# Patient Record
Sex: Male | Born: 1980 | Race: White | Hispanic: No | Marital: Single | Smoking: Current every day smoker
Health system: Southern US, Community
[De-identification: ages and names within clinical notes are randomized; demographics above are authoritative.]

## PROBLEM LIST (undated history)

## (undated) DIAGNOSIS — R269 Unspecified abnormalities of gait and mobility: Secondary | ICD-10-CM

## (undated) DIAGNOSIS — F419 Anxiety disorder, unspecified: Secondary | ICD-10-CM

## (undated) DIAGNOSIS — I10 Essential (primary) hypertension: Secondary | ICD-10-CM

## (undated) DIAGNOSIS — Z87442 Personal history of urinary calculi: Secondary | ICD-10-CM

## (undated) DIAGNOSIS — F32A Depression, unspecified: Secondary | ICD-10-CM

## (undated) DIAGNOSIS — G894 Chronic pain syndrome: Secondary | ICD-10-CM

## (undated) DIAGNOSIS — F1910 Other psychoactive substance abuse, uncomplicated: Secondary | ICD-10-CM

## (undated) DIAGNOSIS — M4714 Other spondylosis with myelopathy, thoracic region: Secondary | ICD-10-CM

## (undated) DIAGNOSIS — F1010 Alcohol abuse, uncomplicated: Secondary | ICD-10-CM

## (undated) DIAGNOSIS — G8220 Paraplegia, unspecified: Secondary | ICD-10-CM

## (undated) DIAGNOSIS — F329 Major depressive disorder, single episode, unspecified: Secondary | ICD-10-CM

## (undated) DIAGNOSIS — G8929 Other chronic pain: Secondary | ICD-10-CM

## (undated) DIAGNOSIS — M549 Dorsalgia, unspecified: Secondary | ICD-10-CM

## (undated) HISTORY — PX: HERNIA REPAIR: SHX51

## (undated) HISTORY — DX: Major depressive disorder, single episode, unspecified: F32.9

## (undated) HISTORY — PX: BACK SURGERY: SHX140

## (undated) HISTORY — DX: Personal history of urinary calculi: Z87.442

## (undated) HISTORY — DX: Anxiety disorder, unspecified: F41.9

## (undated) HISTORY — DX: Chronic pain syndrome: G89.4

## (undated) HISTORY — PX: MANDIBLE FRACTURE SURGERY: SHX706

## (undated) HISTORY — DX: Depression, unspecified: F32.A

## (undated) HISTORY — DX: Other spondylosis with myelopathy, thoracic region: M47.14

## (undated) HISTORY — DX: Unspecified abnormalities of gait and mobility: R26.9

---
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
## 2011-07-02 ENCOUNTER — Encounter (HOSPITAL_COMMUNITY): Payer: Self-pay | Admitting: *Deleted

## 2011-07-02 ENCOUNTER — Other Ambulatory Visit: Payer: Self-pay

## 2011-07-02 ENCOUNTER — Emergency Department (HOSPITAL_COMMUNITY): Payer: Medicaid Other

## 2011-07-02 ENCOUNTER — Inpatient Hospital Stay (HOSPITAL_COMMUNITY)
Admission: EM | Admit: 2011-07-02 | Discharge: 2011-07-08 | DRG: 208 | Disposition: A | Discharge status: Home Health | Payer: Medicaid Other | Attending: Pulmonary Disease | Admitting: Pulmonary Disease

## 2011-07-02 DIAGNOSIS — J9600 Acute respiratory failure, unspecified whether with hypoxia or hypercapnia: Secondary | ICD-10-CM

## 2011-07-02 DIAGNOSIS — R197 Diarrhea, unspecified: Secondary | ICD-10-CM | POA: Diagnosis not present

## 2011-07-02 DIAGNOSIS — G8929 Other chronic pain: Secondary | ICD-10-CM | POA: Diagnosis present

## 2011-07-02 DIAGNOSIS — E872 Acidosis, unspecified: Secondary | ICD-10-CM | POA: Diagnosis present

## 2011-07-02 DIAGNOSIS — J189 Pneumonia, unspecified organism: Secondary | ICD-10-CM

## 2011-07-02 DIAGNOSIS — E876 Hypokalemia: Secondary | ICD-10-CM | POA: Diagnosis present

## 2011-07-02 DIAGNOSIS — F4542 Pain disorder with related psychological factors: Secondary | ICD-10-CM | POA: Diagnosis present

## 2011-07-02 DIAGNOSIS — J209 Acute bronchitis, unspecified: Secondary | ICD-10-CM | POA: Diagnosis present

## 2011-07-02 DIAGNOSIS — F17210 Nicotine dependence, cigarettes, uncomplicated: Secondary | ICD-10-CM | POA: Diagnosis present

## 2011-07-02 DIAGNOSIS — J159 Unspecified bacterial pneumonia: Secondary | ICD-10-CM | POA: Diagnosis present

## 2011-07-02 DIAGNOSIS — T40601A Poisoning by unspecified narcotics, accidental (unintentional), initial encounter: Secondary | ICD-10-CM | POA: Diagnosis present

## 2011-07-02 DIAGNOSIS — E46 Unspecified protein-calorie malnutrition: Secondary | ICD-10-CM | POA: Diagnosis present

## 2011-07-02 DIAGNOSIS — F17200 Nicotine dependence, unspecified, uncomplicated: Secondary | ICD-10-CM | POA: Diagnosis present

## 2011-07-02 DIAGNOSIS — R4182 Altered mental status, unspecified: Secondary | ICD-10-CM | POA: Diagnosis present

## 2011-07-02 DIAGNOSIS — R404 Transient alteration of awareness: Secondary | ICD-10-CM

## 2011-07-02 DIAGNOSIS — F1910 Other psychoactive substance abuse, uncomplicated: Secondary | ICD-10-CM | POA: Diagnosis present

## 2011-07-02 DIAGNOSIS — T50901A Poisoning by unspecified drugs, medicaments and biological substances, accidental (unintentional), initial encounter: Secondary | ICD-10-CM | POA: Diagnosis present

## 2011-07-02 DIAGNOSIS — E870 Hyperosmolality and hypernatremia: Secondary | ICD-10-CM | POA: Diagnosis present

## 2011-07-02 DIAGNOSIS — G8220 Paraplegia, unspecified: Secondary | ICD-10-CM | POA: Diagnosis present

## 2011-07-02 DIAGNOSIS — Z87828 Personal history of other (healed) physical injury and trauma: Secondary | ICD-10-CM

## 2011-07-02 DIAGNOSIS — F319 Bipolar disorder, unspecified: Secondary | ICD-10-CM | POA: Diagnosis present

## 2011-07-02 DIAGNOSIS — Z9911 Dependence on respirator [ventilator] status: Secondary | ICD-10-CM

## 2011-07-02 DIAGNOSIS — Z22322 Carrier or suspected carrier of Methicillin resistant Staphylococcus aureus: Secondary | ICD-10-CM

## 2011-07-02 HISTORY — DX: Alcohol abuse, uncomplicated: F10.10

## 2011-07-02 HISTORY — DX: Other psychoactive substance abuse, uncomplicated: F19.10

## 2011-07-02 LAB — POCT I-STAT 3, ART BLOOD GAS (G3+)
Acid-base deficit: 5 mmol/L — ABNORMAL HIGH (ref 0.0–2.0)
Bicarbonate: 22 mEq/L (ref 20.0–24.0)
Bicarbonate: 22.4 mEq/L (ref 20.0–24.0)
O2 Saturation: 88 %
O2 Saturation: 100 %
pCO2 arterial: 51.6 mmHg — ABNORMAL HIGH (ref 35.0–45.0)
pH, Arterial: 7.257 — ABNORMAL LOW (ref 7.350–7.450)
pO2, Arterial: 65 mmHg — ABNORMAL LOW (ref 80.0–100.0)

## 2011-07-02 LAB — CBC
HCT: 32.9 % — ABNORMAL LOW (ref 39.0–52.0)
Platelets: 268 10*3/uL (ref 150–400)
RDW: 13.2 % (ref 11.5–15.5)
WBC: 17.3 10*3/uL — ABNORMAL HIGH (ref 4.0–10.5)

## 2011-07-02 LAB — DIFFERENTIAL
Basophils Absolute: 0 10*3/uL (ref 0.0–0.1)
Lymphocytes Relative: 9 % — ABNORMAL LOW (ref 12–46)
Monocytes Relative: 9 % (ref 3–12)
Neutrophils Relative %: 82 % — ABNORMAL HIGH (ref 43–77)

## 2011-07-02 LAB — RAPID URINE DRUG SCREEN, HOSP PERFORMED
Amphetamines: NOT DETECTED
Barbiturates: NOT DETECTED
Benzodiazepines: POSITIVE — AB

## 2011-07-02 LAB — URINE MICROSCOPIC-ADD ON

## 2011-07-02 LAB — POCT I-STAT, CHEM 8
BUN: 16 mg/dL (ref 6–23)
Calcium, Ion: 1.06 mmol/L — ABNORMAL LOW (ref 1.12–1.32)
Glucose, Bld: 76 mg/dL (ref 70–99)
TCO2: 21 mmol/L (ref 0–100)

## 2011-07-02 LAB — POCT I-STAT TROPONIN I: Troponin i, poc: 0.04 ng/mL (ref 0.00–0.08)

## 2011-07-02 LAB — ETHANOL: Alcohol, Ethyl (B): <11 mg/dL (ref 0–11)

## 2011-07-02 LAB — GLUCOSE, CAPILLARY: Glucose-Capillary: 116 mg/dL — ABNORMAL HIGH (ref 70–99)

## 2011-07-02 LAB — URINALYSIS, ROUTINE W REFLEX MICROSCOPIC
Glucose, UA: 500 mg/dL — AB
Ketones, ur: NEGATIVE mg/dL
Nitrite: NEGATIVE
Protein, ur: 30 mg/dL — AB

## 2011-07-02 LAB — PROTIME-INR
INR: 1.22 (ref 0.00–1.49)
Prothrombin Time: 15.7 seconds — ABNORMAL HIGH (ref 11.6–15.2)

## 2011-07-02 LAB — MRSA PCR SCREENING: MRSA by PCR: POSITIVE — AB

## 2011-07-02 LAB — GRAM STAIN

## 2011-07-02 MED ORDER — NALOXONE HCL 0.4 MG/ML IJ SOLN: 0.4000 mg | Freq: Once | INTRAMUSCULAR | Status: DC

## 2011-07-02 MED ORDER — LIDOCAINE HCL (CARDIAC) 20 MG/ML IV SOLN
INTRAVENOUS | Status: AC
  Filled 2011-07-02: qty 5

## 2011-07-02 MED ORDER — KCL IN DEXTROSE-NACL 40-5-0.45 MEQ/L-%-% IV SOLN
INTRAVENOUS | Status: DC
  Administered 2011-07-02 – 2011-07-03 (×2): via INTRAVENOUS
  Filled 2011-07-02 (×6): qty 1000

## 2011-07-02 MED ORDER — BIOTENE DRY MOUTH MT LIQD
15.0000 mL | Freq: Four times a day (QID) | OROMUCOSAL | Status: DC
  Administered 2011-07-02 – 2011-07-08 (×15): 15 mL via OROMUCOSAL

## 2011-07-02 MED ORDER — NALOXONE HCL 1 MG/ML IJ SOLN
INTRAMUSCULAR | Status: AC
  Administered 2011-07-02: 0.4 mg
  Filled 2011-07-02: qty 2

## 2011-07-02 MED ORDER — SODIUM CHLORIDE 0.9 % IJ SOLN
INTRAMUSCULAR | Status: AC
  Administered 2011-07-02: 20 mL
  Filled 2011-07-02: qty 40

## 2011-07-02 MED ORDER — SODIUM CHLORIDE 0.9 % IV SOLN
25.0000 ug/h | INTRAVENOUS | Status: DC
  Administered 2011-07-02: 25 ug/h via INTRAVENOUS
  Filled 2011-07-02: qty 50

## 2011-07-02 MED ORDER — FENTANYL BOLUS VIA INFUSION
50.0000 ug | Freq: Four times a day (QID) | INTRAVENOUS | Status: DC | PRN
  Filled 2011-07-02: qty 100

## 2011-07-02 MED ORDER — ENOXAPARIN SODIUM 40 MG/0.4ML ~~LOC~~ SOLN
40.0000 mg | SUBCUTANEOUS | Status: DC
  Administered 2011-07-02 – 2011-07-04 (×3): 40 mg via SUBCUTANEOUS
  Filled 2011-07-02 (×4): qty 0.4

## 2011-07-02 MED ORDER — VANCOMYCIN HCL IN DEXTROSE 1-5 GM/200ML-% IV SOLN: 1000.0000 mg | Freq: Once | INTRAVENOUS | Status: DC

## 2011-07-02 MED ORDER — PROPOFOL 10 MG/ML IV EMUL
5.0000 ug/kg/min | INTRAVENOUS | Status: DC
  Administered 2011-07-02: 20 ug/kg/min via INTRAVENOUS
  Administered 2011-07-02: 10 ug/kg/min via INTRAVENOUS
  Administered 2011-07-03: 20 ug/kg/min via INTRAVENOUS
  Filled 2011-07-02: qty 100
  Filled 2011-07-02: qty 20
  Filled 2011-07-02: qty 100

## 2011-07-02 MED ORDER — SODIUM CHLORIDE 0.9 % IJ SOLN
INTRAMUSCULAR | Status: AC
  Administered 2011-07-03: 10 mL
  Filled 2011-07-02: qty 20

## 2011-07-02 MED ORDER — ETOMIDATE 2 MG/ML IV SOLN
INTRAVENOUS | Status: AC
  Administered 2011-07-02: 20 mg
  Filled 2011-07-02: qty 20

## 2011-07-02 MED ORDER — VANCOMYCIN HCL IN DEXTROSE 1-5 GM/200ML-% IV SOLN
1000.0000 mg | Freq: Three times a day (TID) | INTRAVENOUS | Status: DC
  Administered 2011-07-02 – 2011-07-05 (×9): 1000 mg via INTRAVENOUS
  Filled 2011-07-02 (×9): qty 200

## 2011-07-02 MED ORDER — IPRATROPIUM-ALBUTEROL 18-103 MCG/ACT IN AERO
6.0000 | INHALATION_SPRAY | Freq: Four times a day (QID) | RESPIRATORY_TRACT | Status: DC
  Administered 2011-07-02 – 2011-07-04 (×7): 6 via RESPIRATORY_TRACT
  Filled 2011-07-02: qty 14.7

## 2011-07-02 MED ORDER — ACETAMINOPHEN 160 MG/5ML PO SOLN: 650.0000 mg | ORAL | Status: DC | PRN

## 2011-07-02 MED ORDER — NALOXONE HCL 0.4 MG/ML IJ SOLN
0.4000 mg | Freq: Once | INTRAMUSCULAR | Status: AC
  Administered 2011-07-02: 0.4 mg via INTRAVENOUS

## 2011-07-02 MED ORDER — DEXTROSE 5 % IV SOLN: 1.0000 g | Freq: Once | INTRAVENOUS | Status: DC

## 2011-07-02 MED ORDER — PROPOFOL 10 MG/ML IV EMUL: 5.0000 ug/kg/min | INTRAVENOUS | Status: DC

## 2011-07-02 MED ORDER — SUCCINYLCHOLINE CHLORIDE 20 MG/ML IJ SOLN
INTRAMUSCULAR | Status: AC
  Administered 2011-07-02: 100 mg via INTRAVENOUS
  Filled 2011-07-02: qty 10

## 2011-07-02 MED ORDER — SODIUM CHLORIDE 0.9 % IV BOLUS (SEPSIS)
1000.0000 mL | Freq: Once | INTRAVENOUS | Status: AC
  Administered 2011-07-02: 1000 mL via INTRAVENOUS

## 2011-07-02 MED ORDER — PIPERACILLIN-TAZOBACTAM 3.375 G IVPB
3.3750 g | Freq: Three times a day (TID) | INTRAVENOUS | Status: DC
  Administered 2011-07-02 – 2011-07-05 (×8): 3.375 g via INTRAVENOUS
  Filled 2011-07-02 (×9): qty 50

## 2011-07-02 MED ORDER — PIPERACILLIN-TAZOBACTAM 3.375 G IVPB
3.3750 g | Freq: Once | INTRAVENOUS | Status: AC
  Administered 2011-07-02: 3.375 g via INTRAVENOUS
  Filled 2011-07-02: qty 50

## 2011-07-02 MED ORDER — PANTOPRAZOLE SODIUM 40 MG IV SOLR
40.0000 mg | Freq: Every day | INTRAVENOUS | Status: DC
  Administered 2011-07-02: 40 mg via INTRAVENOUS
  Filled 2011-07-02 (×2): qty 40

## 2011-07-02 MED ORDER — ROCURONIUM BROMIDE 50 MG/5ML IV SOLN
INTRAVENOUS | Status: AC
  Filled 2011-07-02: qty 2

## 2011-07-02 MED ORDER — CHLORHEXIDINE GLUCONATE 0.12 % MT SOLN
15.0000 mL | Freq: Two times a day (BID) | OROMUCOSAL | Status: DC
  Administered 2011-07-02 – 2011-07-04 (×4): 15 mL via OROMUCOSAL
  Filled 2011-07-02 (×4): qty 15

## 2011-07-02 NOTE — ED Notes (Signed)
2115-01 Ready 

## 2011-07-02 NOTE — ED Notes (Signed)
Father at bedside. Pt follow commands when awake. Pt on bear hugger and fluid warmer. Dad states is on pain management but has not been able to fight the pain from his back injury.

## 2011-07-02 NOTE — ED Notes (Signed)
o2 sat 100 % with pt on vent per RT.  Pharmacy called again for diprivan gtt.  Pt tolerating vent well at present.

## 2011-07-02 NOTE — ED Notes (Signed)
Pt asked to cough, arrived back from ct with nrb off and tech stated tank ran out.  Placed back on nrb. Md at bedside. Gave narcan 0.4mg  iv and o2 sat returned to 100% on nrb.

## 2011-07-02 NOTE — Progress Notes (Signed)
ANTIBIOTIC CONSULT NOTE - INITIAL  Pharmacy Consult for Vancomycin & Zosyn Indication: rule out pneumonia  No Known Allergies  Patient Measurements: Height: 6' (182.9 cm) Weight: 180 lb (81.647 kg) IBW/kg (Calculated) : 77.6   Vital Signs: Temp: 99 F (37.2 C) (02/14 1430) Temp src: Other (Comment) (02/14 1300) BP: 97/50 mmHg (02/14 1430) Pulse Rate: 111  (02/14 1430) Intake/Output from previous day:   Intake/Output from this shift:    Labs:  Basename 07/02/11 1121 07/02/11 1057  WBC -- 17.3*  HGB 11.2* 10.6*  PLT -- 268  LABCREA -- --  CREATININE 1.20 --   Estimated Creatinine Clearance: 99.7 ml/min (by C-G formula based on Cr of 1.2).  Microbiology: No results found for this or any previous visit (from the past 720 hour(s)).  Medical History: Past Medical History  Diagnosis Date  . Bipolar 1 disorder, depressed   . Drug abuse     Medications:  See med rec  Assessment: 31 yo M admitted after found unresponsive this morning, CPR performed, Narcan given and patient become responsive.  Patient recently had spinal surgery in December.  To begin Vancomycin and Zosyn empirically for possible pneumonia (Zosyn 3.375 gm IV given at 1437 and Vancomycin 1000 mg IV to be given in the ED).  Patient is currently intubated.    Goal of Therapy:  Vancomycin trough level 15-20 mcg/ml  Plan:  1.  Zosyn 3.375 gm IV q 8hr 2.  Vancomycin 1000 mg IV q8hr 3.  F/U renal function, cultures, obtain vancomycin trough as needed  Rolland Porter, Pharm.D., BCPS Clinical Pharmacist Pager: [phone]

## 2011-07-02 NOTE — Procedures (Addendum)
Central Venous Catheter Insertion Procedure Note Jason Dominguez [id number] 21-Mar-1981  Procedure: Insertion of Central Venous Catheter Indications: Drug and/or fluid administration  Procedure Details Consent: Risks of procedure as well as the alternatives and risks of each were explained to the (patient/caregiver).  Consent for procedure obtained. Time Out: Verified patient identification, verified procedure, site/side was marked, verified correct patient position, special equipment/implants available, medications/allergies/relevent history reviewed, required imaging and test results available.  Performed  Maximum sterile technique was used including antiseptics, cap, gloves, gown, hand hygiene, mask and sheet. Skin prep: Chlorhexidine; local anesthetic administered A antimicrobial bonded/coated triple lumen catheter was placed in the left subclavian vein using the Seldinger technique.  Evaluation Blood flow good Complications: No apparent complications Patient did tolerate procedure well. Chest X-ray ordered to verify placement.  CXR: CVL well positioned. No pneumothorax.  Jason Dominguez 07/02/2011, 11:20 PM

## 2011-07-02 NOTE — ED Notes (Signed)
Intubated with 8.0 taped 24 at the lip with good color change and chest rise and fall and auscultation positive.
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
## 2011-07-02 NOTE — ED Notes (Signed)
Pt sedated on diprivan and tolerating vent well. Vss.

## 2011-07-02 NOTE — ED Notes (Signed)
Md placing central line at present, received verbal consent from father, Eula Listen aware as well.

## 2011-07-02 NOTE — Procedures (Addendum)
Intubation Procedure Note Jason Dominguez [id number] [birth year]  Procedure: Intubation Indications: Airway protection and maintenance  Procedure Details Consent: Risks of procedure as well as the alternatives and risks of each were explained to the (patient/caregiver).  Consent for procedure obtained. Time Out: Verified patient identification, verified procedure, site/side was marked, verified correct patient position, special equipment/implants available, medications/allergies/relevent history reviewed, required imaging and test results available.  Performed  Could not adequately visualize airway with #3 Mac blade. Cords easily visualized with Glidescope. 8.0 ETT passed on second attempt    Evaluation Hemodynamic Status: BP stable throughout; O2 sats: transiently fell during during procedure Patient's Current Condition: stable Complications: No apparent complications Patient did tolerate procedure well. Chest X-ray ordered to verify placement.  CXR: tube position acceptable.   Jason Dominguez 07/02/2011

## 2011-07-02 NOTE — ED Notes (Signed)
Pt to ct 

## 2011-07-02 NOTE — ED Notes (Signed)
Bear hugger and fluid warmer off. Normotensive now.

## 2011-07-02 NOTE — ED Notes (Signed)
Check patient cbg it was 14 notified RN Nedra Hai of cbg

## 2011-07-02 NOTE — ED Notes (Signed)
Xray called for portable  

## 2011-07-02 NOTE — ED Notes (Signed)
Attempted to call report, will try again when nurse available.

## 2011-07-02 NOTE — ED Notes (Signed)
Patient presents to ed via [facility] family last saw at 230a, was found 10 am unresponsive ems was called, ems found patient pulseless, 3 mins of CPR and was given narcan 2 mg IV patient became responsive. [name] Upon arrival to ed  Responses to deep sternal rub with movement and moaning no verbal response. ems states patient had surgery in Dec and had rods put in his back was discharged from rehab. Yest. Patient has been using fentanyl patches unable to find any on at present.

## 2011-07-03 ENCOUNTER — Inpatient Hospital Stay (HOSPITAL_COMMUNITY): Payer: Medicaid Other

## 2011-07-03 LAB — POCT I-STAT 3, ART BLOOD GAS (G3+)
Acid-base deficit: 1 mmol/L (ref 0.0–2.0)
O2 Saturation: 99 %
TCO2: 26 mmol/L (ref 0–100)
pCO2 arterial: 43.8 mmHg (ref 35.0–45.0)
pO2, Arterial: 132 mmHg — ABNORMAL HIGH (ref 80.0–100.0)

## 2011-07-03 LAB — BASIC METABOLIC PANEL
BUN: 15 mg/dL (ref 6–23)
CO2: 26 mEq/L (ref 19–32)
GFR calc non Af Amer: >90 mL/min (ref >90)
Glucose, Bld: 116 mg/dL — ABNORMAL HIGH (ref 70–99)
Potassium: 3.5 mEq/L (ref 3.5–5.1)
Sodium: 139 mEq/L (ref 135–145)

## 2011-07-03 LAB — CBC
HCT: 34.4 % — ABNORMAL LOW (ref 39.0–52.0)
Hemoglobin: 11.5 g/dL — ABNORMAL LOW (ref 13.0–17.0)
MCH: 28.9 pg (ref 26.0–34.0)
MCHC: 33.4 g/dL (ref 30.0–36.0)
RBC: 3.98 MIL/uL — ABNORMAL LOW (ref 4.22–5.81)

## 2011-07-03 MED ORDER — SODIUM CHLORIDE 0.9 % IJ SOLN
INTRAMUSCULAR | Status: AC
  Filled 2011-07-03: qty 20

## 2011-07-03 MED ORDER — SODIUM CHLORIDE 0.9 % IV SOLN
INTRAVENOUS | Status: DC
  Administered 2011-07-03: 16:00:00 via INTRAVENOUS

## 2011-07-03 MED ORDER — FENTANYL CITRATE 0.05 MG/ML IJ SOLN
100.0000 ug | INTRAMUSCULAR | Status: DC | PRN
  Administered 2011-07-04 (×3): 100 ug via INTRAVENOUS
  Filled 2011-07-03 (×3): qty 2

## 2011-07-03 MED ORDER — SODIUM CHLORIDE 0.9 % IJ SOLN
INTRAMUSCULAR | Status: AC
  Administered 2011-07-03: 20 mL
  Filled 2011-07-03: qty 20

## 2011-07-03 MED ORDER — PROMOTE PO LIQD
1000.0000 mL | ORAL | Status: DC
  Administered 2011-07-03: 1000 mL
  Filled 2011-07-03 (×6): qty 1000

## 2011-07-03 MED ORDER — MIDAZOLAM HCL 2 MG/2ML IJ SOLN: 2.0000 mg | INTRAMUSCULAR | Status: DC | PRN

## 2011-07-03 MED ORDER — PANTOPRAZOLE SODIUM 40 MG PO PACK
40.0000 mg | PACK | Freq: Every day | ORAL | Status: DC
  Administered 2011-07-03: 40 mg
  Filled 2011-07-03 (×2): qty 20

## 2011-07-03 MED ORDER — FENTANYL CITRATE 0.05 MG/ML IJ SOLN
50.0000 ug | INTRAMUSCULAR | Status: DC | PRN
  Administered 2011-07-03: 100 ug via INTRAVENOUS
  Filled 2011-07-03: qty 2
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
## 2011-07-03 NOTE — Progress Notes (Signed)
Clinical Social Worker met with pt and parents at bedside.  CSW provided extensive emotional support and opportunity for family to process difficult feelings.  CSW provided contact information.  CSW to continue to follow and assist as needed.  Angelia Mould, MSW, [place] [phone]
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
## 2011-07-03 NOTE — Progress Notes (Signed)
Clinical Social Worker received completed assessment from ED CSW.  Please see shadow chart for details.  CSW to continue to follow and assist as needed.  Angelia Mould, MSW, [place] [phone]

## 2011-07-04 ENCOUNTER — Inpatient Hospital Stay (HOSPITAL_COMMUNITY): Payer: Medicaid Other

## 2011-07-04 LAB — BASIC METABOLIC PANEL
BUN: 10 mg/dL (ref 6–23)
CO2: 29 mEq/L (ref 19–32)
Chloride: 105 mEq/L (ref 96–112)
GFR calc non Af Amer: >90 mL/min (ref >90)
Glucose, Bld: 115 mg/dL — ABNORMAL HIGH (ref 70–99)
Potassium: 3.2 mEq/L — ABNORMAL LOW (ref 3.5–5.1)
Sodium: 141 mEq/L (ref 135–145)

## 2011-07-04 LAB — CBC
MCHC: 33.8 g/dL (ref 30.0–36.0)
RDW: 13.3 % (ref 11.5–15.5)
WBC: 8.4 10*3/uL (ref 4.0–10.5)

## 2011-07-04 LAB — VANCOMYCIN, TROUGH: Vancomycin Tr: 14.2 ug/mL (ref 10.0–20.0)

## 2011-07-04 MED ORDER — TRAMADOL HCL 50 MG PO TABS
50.0000 mg | ORAL_TABLET | Freq: Four times a day (QID) | ORAL | Status: DC | PRN
  Administered 2011-07-06: 50 mg via ORAL
  Filled 2011-07-04 (×2): qty 1

## 2011-07-04 MED ORDER — PANTOPRAZOLE SODIUM 40 MG PO PACK
40.0000 mg | PACK | Freq: Every day | ORAL | Status: DC
  Filled 2011-07-04: qty 20

## 2011-07-04 MED ORDER — DOCUSATE SODIUM 100 MG PO CAPS
100.0000 mg | ORAL_CAPSULE | Freq: Two times a day (BID) | ORAL | Status: DC
  Administered 2011-07-04 – 2011-07-05 (×3): 100 mg via ORAL
  Filled 2011-07-04 (×4): qty 1

## 2011-07-04 MED ORDER — IPRATROPIUM BROMIDE 0.02 % IN SOLN
0.5000 mg | Freq: Four times a day (QID) | RESPIRATORY_TRACT | Status: DC
  Administered 2011-07-04 – 2011-07-05 (×4): 0.5 mg via RESPIRATORY_TRACT
  Filled 2011-07-04 (×4): qty 2.5

## 2011-07-04 MED ORDER — ALBUTEROL SULFATE (5 MG/ML) 0.5% IN NEBU
2.5000 mg | INHALATION_SOLUTION | Freq: Four times a day (QID) | RESPIRATORY_TRACT | Status: DC
  Administered 2011-07-04 – 2011-07-05 (×4): 2.5 mg via RESPIRATORY_TRACT
  Filled 2011-07-04 (×4): qty 0.5

## 2011-07-04 MED ORDER — BACLOFEN 10 MG PO TABS
10.0000 mg | ORAL_TABLET | Freq: Three times a day (TID) | ORAL | Status: DC
  Administered 2011-07-04 – 2011-07-08 (×13): 10 mg via ORAL
  Filled 2011-07-04 (×15): qty 1

## 2011-07-04 MED ORDER — ALPRAZOLAM 0.25 MG PO TABS
0.2500 mg | ORAL_TABLET | Freq: Four times a day (QID) | ORAL | Status: DC | PRN
  Administered 2011-07-04 – 2011-07-08 (×6): 0.25 mg via ORAL
  Filled 2011-07-04 (×7): qty 1

## 2011-07-04 MED ORDER — SODIUM CHLORIDE 0.9 % IJ SOLN
INTRAMUSCULAR | Status: AC
  Filled 2011-07-04: qty 20

## 2011-07-04 MED ORDER — PANTOPRAZOLE SODIUM 40 MG PO TBEC
40.0000 mg | DELAYED_RELEASE_TABLET | Freq: Every day | ORAL | Status: DC
  Administered 2011-07-05: 40 mg via ORAL
  Filled 2011-07-04: qty 1

## 2011-07-04 MED ORDER — ACETAMINOPHEN 160 MG/5ML PO SOLN: 650.0000 mg | ORAL | Status: DC | PRN

## 2011-07-04 MED ORDER — MORPHINE SULFATE 2 MG/ML IJ SOLN: 1.0000 mg | INTRAMUSCULAR | Status: DC | PRN

## 2011-07-04 MED ORDER — GABAPENTIN 300 MG PO CAPS
300.0000 mg | ORAL_CAPSULE | Freq: Three times a day (TID) | ORAL | Status: DC
  Administered 2011-07-04 – 2011-07-08 (×12): 300 mg via ORAL
  Filled 2011-07-04 (×15): qty 1

## 2011-07-04 NOTE — Progress Notes (Signed)
ANTIBIOTIC CONSULT NOTE - FOLLOW UP  Pharmacy Consult for vancomycin Indication: rule out pneumonia  No Known Allergies  Patient Measurements: Height: 6' (182.9 cm) Weight: 191 lb 9.3 oz (86.9 kg) IBW/kg (Calculated) : 77.6   Vital Signs: Temp: 99.3 F (37.4 C) (02/16 1500) BP: 118/55 mmHg (02/16 1500) Pulse Rate: 105  (02/16 1500) Intake/Output from previous day: 02/15 0701 - 02/16 0700 In: 2249.2 [I.V.:1329.2; NG/GT:420; IV Piggyback:500] Out: 1930 [Urine:1755; Emesis/NG output:175] Intake/Output from this shift: Total I/O In: 700 [I.V.:325; NG/GT:125; IV Piggyback:250] Out: 825 [Urine:825]  Labs:  [facility] 07/04/11 0500 07/03/11 0500 07/02/11 1121 07/02/11 1057  WBC 8.4 12.3* -- 17.3*  HGB 9.9* 11.5* 11.2* --  PLT 214 228 -- 268  LABCREA -- -- -- --  CREATININE 0.67 0.86 1.20 --   Estimated Creatinine Clearance: 149.5 ml/min (by C-G formula based on Cr of 0.67).  Basename 07/04/11 0500  VANCOTROUGH 14.2  VANCOPEAK --  VANCORANDOM --  GENTTROUGH --  GENTPEAK --  GENTRANDOM --  TOBRATROUGH --  TOBRAPEAK --  TOBRARND --  AMIKACINPEAK --  AMIKACINTROU --  AMIKACIN --     Microbiology: Recent Results (from the past 720 hour(s))  URINE CULTURE     Status: Normal (Preliminary result)   Collection Time   07/02/11 11:31 AM      Component Value Range Status Comment   Specimen Description URINE, RANDOM   Final    Special Requests NONE   Final    Culture  Setup Time [id number]   Final    Colony Count >=100,000 COLONIES/ML   Final    Culture     Final    Value: STAPHYLOCOCCUS AUREUS     Note: RIFAMPIN AND GENTAMICIN SHOULD NOT BE USED AS SINGLE DRUGS FOR TREATMENT OF STAPH INFECTIONS.     ENTEROCOCCUS SPECIES   Report Status PENDING   Incomplete   CULTURE, BLOOD (ROUTINE X 2)     Status: Normal (Preliminary result)   Collection Time   07/02/11  1:45 PM      Component Value Range Status Comment   Specimen Description BLOOD ARTERIAL RIGHT   Final    Special  Requests BOTTLES DRAWN AEROBIC ONLY 9CC   Final    Culture  Setup Time [id number]   Final    Culture     Final    Value:        BLOOD CULTURE RECEIVED NO GROWTH TO DATE CULTURE WILL BE HELD FOR 5 DAYS BEFORE ISSUING A FINAL NEGATIVE REPORT   Report Status PENDING   Incomplete   CULTURE, BLOOD (ROUTINE X 2)     Status: Normal (Preliminary result)   Collection Time   07/02/11  2:20 PM      Component Value Range Status Comment   Specimen Description BLOOD CENTRAL LINE   Final    Special Requests BOTTLES DRAWN AEROBIC ONLY 10CC   Final    Culture  Setup Time [id number]   Final    Culture     Final    Value:        BLOOD CULTURE RECEIVED NO GROWTH TO DATE CULTURE WILL BE HELD FOR 5 DAYS BEFORE ISSUING A FINAL NEGATIVE REPORT   Report Status PENDING   Incomplete   GRAM STAIN     Status: Normal   Collection Time   07/02/11  2:50 PM      Component Value Range Status Comment   Specimen Description OTHER   Final    Special Requests ETT  SUCTION   Final    Gram Stain     Final    Value: FEW WBC PRESENT, PREDOMINANTLY PMN     MODERATE GRAM POSITIVE COCCI IN CLUSTERS     MODERATE GRAM POSITIVE COCCI IN PAIRS IN CHAINS     MODERATE GRAM POSITIVE RODS     FEW GRAM NEGATIVE RODS   Report Status 07/02/2011 FINAL   Final   MRSA PCR SCREENING     Status: Abnormal   Collection Time   07/02/11  5:00 PM      Component Value Range Status Comment   MRSA by PCR POSITIVE (*) NEGATIVE  Final     Anti-infectives     Start     Dose/Rate Route Frequency Ordered Stop   07/02/11 2100  piperacillin-tazobactam (ZOSYN) IVPB 3.375 g       3.375 g 12.5 mL/hr over 240 Minutes Intravenous 3 times per day 07/02/11 1548     07/02/11 1500   vancomycin (VANCOCIN) IVPB 1000 mg/200 mL premix        1,000 mg 200 mL/hr over 60 Minutes Intravenous Every 8 hours 07/02/11 1457     07/02/11 1300   cefTRIAXone (ROCEPHIN) 1 g in dextrose 5 % 50 mL IVPB  Status:  Discontinued        1 g 100 mL/hr over 30 Minutes  Intravenous  Once 07/02/11 1248 07/02/11 1258   07/02/11 1300   vancomycin (VANCOCIN) IVPB 1000 mg/200 mL premix  Status:  Discontinued        1,000 mg 200 mL/hr over 60 Minutes Intravenous  Once 07/02/11 1258 07/02/11 1457   07/02/11 1300  piperacillin-tazobactam (ZOSYN) IVPB 3.375 g       3.375 g 12.5 mL/hr over 240 Minutes Intravenous  Once 07/02/11 1258 07/02/11 1837          Assessment: 31 yo M with vanc trough of 14.2 this am - just slightly less than goal.  Will consider this acceptable given proximity to goal range.  Patient extubated this am.  Goal of Therapy:  Vancomycin trough level 15-20 mcg/ml  Plan:  1.  Continue same vancomycin 1g IV q8h. 2.  F/u LOT.  Danford Tat L. Illene Bolus, PharmD, BCPS Clinical Pharmacist Pager: [phone] 07/04/2011 3:49 PM

## 2011-07-04 NOTE — Procedures (Signed)
Extubation Procedure Note  Patient Details:   Name: Jason Dominguez DOB: 07/16/1980 MRN: [id number]   Airway Documentation:     Evaluation  O2 sats: stable throughout Complications: No apparent complications Patient did tolerate procedure well. Bilateral Breath Sounds: Rhonchi Suctioning: Airway Yes, pt able to speak and adequate cough.  Placed on 2L [place], spo2 100, HR 111, RR 14, B/P 150/78.  RT will monitor.  Gerome Apley 07/04/2011, 10:42 AM
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
## 2011-07-05 DIAGNOSIS — Z9911 Dependence on respirator [ventilator] status: Secondary | ICD-10-CM

## 2011-07-05 DIAGNOSIS — J189 Pneumonia, unspecified organism: Secondary | ICD-10-CM

## 2011-07-05 DIAGNOSIS — R4182 Altered mental status, unspecified: Secondary | ICD-10-CM

## 2011-07-05 DIAGNOSIS — J9600 Acute respiratory failure, unspecified whether with hypoxia or hypercapnia: Secondary | ICD-10-CM

## 2011-07-05 LAB — BASIC METABOLIC PANEL
Calcium: 9.5 mg/dL (ref 8.4–10.5)
GFR calc Af Amer: >90 mL/min (ref >90)
GFR calc non Af Amer: >90 mL/min (ref >90)
Potassium: 3.1 mEq/L — ABNORMAL LOW (ref 3.5–5.1)
Sodium: 144 mEq/L (ref 135–145)

## 2011-07-05 LAB — URINE CULTURE

## 2011-07-05 LAB — CBC
Hemoglobin: 9.2 g/dL — ABNORMAL LOW (ref 13.0–17.0)
Platelets: 220 10*3/uL (ref 150–400)
RBC: 3.15 MIL/uL — ABNORMAL LOW (ref 4.22–5.81)
WBC: 6.1 10*3/uL (ref 4.0–10.5)

## 2011-07-05 LAB — CLOSTRIDIUM DIFFICILE BY PCR: Toxigenic C. Difficile by PCR: NEGATIVE

## 2011-07-05 LAB — MAGNESIUM: Magnesium: 1.8 mg/dL (ref 1.5–2.5)

## 2011-07-05 MED ORDER — ALBUTEROL SULFATE (5 MG/ML) 0.5% IN NEBU: 2.5000 mg | INHALATION_SOLUTION | RESPIRATORY_TRACT | Status: DC | PRN

## 2011-07-05 MED ORDER — ENOXAPARIN SODIUM 40 MG/0.4ML ~~LOC~~ SOLN: 40.0000 mg | Freq: Every day | SUBCUTANEOUS | Status: DC

## 2011-07-05 MED ORDER — ENOXAPARIN SODIUM 40 MG/0.4ML ~~LOC~~ SOLN
40.0000 mg | Freq: Every day | SUBCUTANEOUS | Status: DC
  Administered 2011-07-05 – 2011-07-08 (×4): 40 mg via SUBCUTANEOUS
  Filled 2011-07-05 (×3): qty 0.4

## 2011-07-05 MED ORDER — POTASSIUM CHLORIDE CRYS ER 20 MEQ PO TBCR
40.0000 meq | EXTENDED_RELEASE_TABLET | Freq: Once | ORAL | Status: AC
  Administered 2011-07-05: 40 meq via ORAL
  Filled 2011-07-05: qty 2

## 2011-07-05 MED ORDER — AMOXICILLIN-POT CLAVULANATE 500-125 MG PO TABS
500.0000 mg | ORAL_TABLET | Freq: Three times a day (TID) | ORAL | Status: DC
  Administered 2011-07-05 (×3): 500 mg via ORAL
  Filled 2011-07-05 (×6): qty 1

## 2011-07-05 NOTE — Progress Notes (Signed)
Patient post foley cath removal today; approximately 4-5 hours; bladder scan ; patient states Self caths. At home about every 4 hours; unable to void.  I&0 cath using sterile technique; obtained urine.
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
## 2011-07-06 ENCOUNTER — Ambulatory Visit: Payer: No Typology Code available for payment source | Admitting: Physical Therapy

## 2011-07-06 DIAGNOSIS — F319 Bipolar disorder, unspecified: Secondary | ICD-10-CM

## 2011-07-06 MED ORDER — FLORA-Q PO CAPS
1.0000 | ORAL_CAPSULE | Freq: Every day | ORAL | Status: DC
  Administered 2011-07-06 – 2011-07-08 (×3): 1 via ORAL
  Filled 2011-07-06 (×3): qty 1

## 2011-07-06 MED ORDER — LEVOFLOXACIN 500 MG PO TABS
500.0000 mg | ORAL_TABLET | Freq: Every day | ORAL | Status: DC
  Administered 2011-07-06 – 2011-07-08 (×3): 500 mg via ORAL
  Filled 2011-07-06 (×3): qty 1

## 2011-07-06 MED ORDER — MUPIROCIN 2 % EX OINT
1.0000 "application " | TOPICAL_OINTMENT | Freq: Two times a day (BID) | CUTANEOUS | Status: DC
  Administered 2011-07-06 – 2011-07-08 (×5): 1 via NASAL
  Filled 2011-07-06: qty 22

## 2011-07-06 MED ORDER — CHLORHEXIDINE GLUCONATE CLOTH 2 % EX PADS
6.0000 | MEDICATED_PAD | Freq: Every day | CUTANEOUS | Status: DC
  Administered 2011-07-07 – 2011-07-08 (×2): 6 via TOPICAL
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
## 2011-07-06 NOTE — Progress Notes (Signed)
Patient refused lab draws this a.m.
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
## 2011-07-07 ENCOUNTER — Encounter (HOSPITAL_COMMUNITY): Payer: Self-pay | Admitting: Physical Medicine and Rehabilitation

## 2011-07-07 DIAGNOSIS — G92 Toxic encephalopathy: Secondary | ICD-10-CM

## 2011-07-07 DIAGNOSIS — J9600 Acute respiratory failure, unspecified whether with hypoxia or hypercapnia: Secondary | ICD-10-CM

## 2011-07-07 DIAGNOSIS — J189 Pneumonia, unspecified organism: Secondary | ICD-10-CM

## 2011-07-07 DIAGNOSIS — F17200 Nicotine dependence, unspecified, uncomplicated: Secondary | ICD-10-CM

## 2011-07-07 DIAGNOSIS — G8220 Paraplegia, unspecified: Secondary | ICD-10-CM

## 2011-07-07 DIAGNOSIS — F3189 Other bipolar disorder: Secondary | ICD-10-CM

## 2011-07-07 DIAGNOSIS — F1910 Other psychoactive substance abuse, uncomplicated: Secondary | ICD-10-CM

## 2011-07-07 DIAGNOSIS — R4182 Altered mental status, unspecified: Secondary | ICD-10-CM

## 2011-07-07 DIAGNOSIS — F1010 Alcohol abuse, uncomplicated: Secondary | ICD-10-CM | POA: Insufficient documentation
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
## 2011-07-07 NOTE — Progress Notes (Signed)
Pt refusing lab draw. Pt educated but continues to refuse. Will continue to monitor pt.
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
## 2011-07-08 DIAGNOSIS — R197 Diarrhea, unspecified: Secondary | ICD-10-CM | POA: Diagnosis not present

## 2011-07-08 MED ORDER — FLORA-Q PO CAPS: 1.0000 | ORAL_CAPSULE | Freq: Every day | ORAL | Status: DC

## 2011-07-08 MED ORDER — GABAPENTIN 600 MG PO TABS: 300.0000 mg | ORAL_TABLET | Freq: Three times a day (TID) | ORAL | Status: DC

## 2011-07-08 MED ORDER — ALPRAZOLAM 0.25 MG PO TABS: 0.2500 mg | ORAL_TABLET | Freq: Four times a day (QID) | ORAL | Status: DC | PRN

## 2011-07-08 NOTE — Progress Notes (Signed)
Physical Therapy Treatment Patient Details Name: Jason Dominguez MRN: [id number] DOB: 12-08-1980 Today's Date: 07/08/2011  PT Assessment/Plan  PT - Assessment/Plan Comments on Treatment Session: pt frustrated today about being in hospital when he was supposed to be starting his OPPT.  pt noting he realizes that he didn't do his best when he was in rehab as he was so depressed and mad at the situation.  He now seems very motivated and wanting to work on his mobility.  Still feel pt would be a good candidate for CIR as he was independent with transfers PTA and now requiring A and need for further therapies to increase independence.  pt also in need of PRAFO for R LE positioning.  Charge RN made aware and sticky note left for MD.   PT Plan: Discharge plan remains appropriate;Frequency remains appropriate PT Frequency: Min 3X/week Recommendations for Other Services: Rehab consult Follow Up Recommendations: Inpatient Rehab Equipment Recommended: Defer to next venue PT Goals  Acute Rehab PT Goals PT Goal: Rolling Supine to Left Side - Progress: Progressing toward goal PT Goal: Supine/Side to Sit - Progress: Progressing toward goal PT Goal: Sit to Supine/Side - Progress: Progressing toward goal PT Transfer Goal: Bed to Chair/Chair to Bed - Progress: Progressing toward goal PT Goal: Perform Home Exercise Program - Progress: Progressing toward goal  PT Treatment Precautions/Restrictions  Precautions Precautions: Fall Precaution Comments: Incomplete Paraplegia.  Required Braces or Orthoses: No Restrictions Weight Bearing Restrictions: No Mobility (including Balance) Bed Mobility Bed Mobility: No Transfers Transfers: Yes Sit to Stand: 3: Mod assist;With upper extremity assist;With armrests;From chair/3-in-1 Sit to Stand Details (indicate cue type and reason): cues and A for UE use, anterior wt shift Stand to Sit: 4: Min assist;With upper extremity assist;With armrests;To chair/3-in-1 Stand to  Sit Details: good use of armrests and control descent Ambulation/Gait Ambulation/Gait: No Stairs: No Wheelchair Mobility Wheelchair Mobility: No  Posture/Postural Control Posture/Postural Control: No significant limitations Balance Balance Assessed: Yes Static Standing Balance Static Standing - Balance Support: Bilateral upper extremity supported Static Standing - Level of Assistance: 4: Min assist Static Standing - Comment/# of Minutes: Standing with Bil UE support working on R LE standing, knee control and upright posture.  Repeated standing x3.   Exercise    End of Session PT - End of Session Equipment Utilized During Treatment: Gait belt Activity Tolerance: Patient tolerated treatment well Patient left: in chair;with call bell in reach Nurse Communication:  (Need for PRAFO for R LE) General Behavior During Session:  (Frustrated and wanting continued Rehab.  ) Cognition: WFL for tasks performed  Sunny Schlein, [place] [phone] 07/08/2011, 12:25 PM

## 2011-07-08 NOTE — Discharge Instructions (Addendum)
Home Health Care Agency will call you:  [facility]  [phone] Will provide home health nurse, physical therapist and occupational therapist.    [facility]  [address] [place], [place]    Phone  [phone]  Appointments:  March 22 10:45  Eligibility                          April 1, 9:30        Hospital follow up with Dr Venetia Night
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
## 2011-07-08 NOTE — Progress Notes (Signed)
Bladder scanned pt. Scan amt was 245cc. Will continue to monitor.
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
## 2011-07-08 NOTE — Progress Notes (Signed)
Rehab admissions - Evaluated for possible admission.  Please see rehab consult done by Dr. Riley Kill 02/19 recommending HH vs Outpatient therapies.  Patient was recently discharged from an inpatient rehab facility.  Agree with HH vs Outpatient therapies.  Pager [phone]

## 2011-07-08 NOTE — Progress Notes (Signed)
Pt. D/c instructions provided. Pt verbalized understanding. Pt PRAFO boot was ordered and received. Pt under no s/s distress. IV d/c intact.

## 2011-07-09 LAB — CULTURE, BLOOD (ROUTINE X 2)
Culture  Setup Time: 201302150027
Culture: NO GROWTH

## 2011-07-10 ENCOUNTER — Telehealth: Payer: Self-pay | Admitting: Adult Health
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
## 2011-07-13 ENCOUNTER — Telehealth: Payer: Self-pay | Admitting: Adult Health

## 2011-07-13 NOTE — Telephone Encounter (Signed)
LMTCBx1. Pt has not been seen in the office yet, and he was given an rx for xanax on 07-08-11 #30 so he should still have some left.  He has a HFU appt on 07-22-11. Carron Curie, CMA

## 2011-07-14 NOTE — Telephone Encounter (Signed)
Spoke with pt's spouse. I advised that TP will not refill the xanax. Looks like VS saw the pt, advised that I will forward to him to see if he is okay with refilling this. Please advise, thanks!

## 2011-07-14 NOTE — Telephone Encounter (Signed)
Please advise he is for a critical care post hospital follow up  Will not refill xanax.  Can defer to d/ch MD if needed.

## 2011-07-14 NOTE — Telephone Encounter (Signed)
Spoke with pt. He states that the quantity given for xanax did not last him long enough b/c he take this every 6 hours. I advised that it should be only used as needed, and he verbalized understanding of this, but states that he does need this every 6 hours, takes 4 times daily and so is requesting another refill to last until ov with TP on 07/22/11. He states currently in the process of finding a new PCP. TP, please advise, thanks!

## 2011-07-14 NOTE — Telephone Encounter (Signed)
Mr dollins return call please cb at  [phone]

## 2011-07-16 MED ORDER — ALPRAZOLAM 0.25 MG PO TABS: 0.2500 mg | ORAL_TABLET | Freq: Four times a day (QID) | ORAL | Status: AC | PRN

## 2011-07-16 NOTE — Telephone Encounter (Signed)
Pt informed that refill for Xanax was sent to [facility] and further refills will need to go through PCP per Dr Craige Cotta.

## 2011-07-16 NOTE — Telephone Encounter (Signed)
Okay to send script for xanax 0.25 mg q6h prn, dispense 60 with one refill.  Please advise Mr. Convey, that future refills for this will need to be made through his PCP and/or psychiatrist.

## 2011-07-22 ENCOUNTER — Ambulatory Visit (INDEPENDENT_AMBULATORY_CARE_PROVIDER_SITE_OTHER)
Admission: RE | Admit: 2011-07-22 | Discharge: 2011-07-22 | Disposition: A | Discharge status: Home / Self Care | Payer: No Typology Code available for payment source | Source: Ambulatory Visit | Attending: Adult Health | Admitting: Adult Health

## 2011-07-22 ENCOUNTER — Ambulatory Visit (INDEPENDENT_AMBULATORY_CARE_PROVIDER_SITE_OTHER): Payer: No Typology Code available for payment source | Admitting: Adult Health

## 2011-07-22 ENCOUNTER — Encounter: Payer: Self-pay | Admitting: Adult Health

## 2011-07-22 ENCOUNTER — Other Ambulatory Visit: Payer: Self-pay | Admitting: Adult Health

## 2011-07-22 VITALS — BP 112/68 | HR 84 | Temp 96.9°F | Ht 71.0 in | Wt 185.0 lb

## 2011-07-22 DIAGNOSIS — J159 Unspecified bacterial pneumonia: Secondary | ICD-10-CM

## 2011-07-22 DIAGNOSIS — T50901A Poisoning by unspecified drugs, medicaments and biological substances, accidental (unintentional), initial encounter: Secondary | ICD-10-CM

## 2011-07-22 DIAGNOSIS — F319 Bipolar disorder, unspecified: Secondary | ICD-10-CM

## 2011-07-22 DIAGNOSIS — J9600 Acute respiratory failure, unspecified whether with hypoxia or hypercapnia: Secondary | ICD-10-CM
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
## 2011-07-22 NOTE — Assessment & Plan Note (Signed)
Resolved  cxr today is clear  No further tx at this time

## 2011-07-22 NOTE — Assessment & Plan Note (Signed)
Referral to Psychiatry to help with hx of polysubstance abuse, recent Overdose and hx of Bipolar disorder

## 2011-07-22 NOTE — Patient Instructions (Signed)
Most important goal is to stop smoking.  We are referring you to a primary doctor .  We are referring you to Psychiatry  You can follow up with our pulmonary office on an as needed basis

## 2011-07-22 NOTE — Assessment & Plan Note (Signed)
Recent hypercarbic resp failure secondary to polysubstance OD  Now resolved.  Refer to Primary care MD and Psychiatry

## 2011-08-03 ENCOUNTER — Ambulatory Visit: Payer: Medicaid Other | Attending: Pain Medicine | Admitting: Physical Therapy

## 2011-08-03 DIAGNOSIS — M6281 Muscle weakness (generalized): Secondary | ICD-10-CM | POA: Insufficient documentation

## 2011-08-03 DIAGNOSIS — IMO0001 Reserved for inherently not codable concepts without codable children: Secondary | ICD-10-CM | POA: Insufficient documentation

## 2011-08-03 DIAGNOSIS — R269 Unspecified abnormalities of gait and mobility: Secondary | ICD-10-CM | POA: Insufficient documentation

## 2011-08-10 ENCOUNTER — Telehealth: Payer: Self-pay | Admitting: Adult Health

## 2011-08-10 NOTE — Telephone Encounter (Signed)
LMTCB

## 2011-08-11 ENCOUNTER — Ambulatory Visit: Payer: Medicaid Other | Admitting: Physical Therapy

## 2011-08-11 NOTE — Telephone Encounter (Signed)
Pt was seen by TP on 07/22/11 for HFU.    Called, spoke with pt who states he will run out of baclofen and neurontin tomorrow.  Reports he has received orange card and is supposed to get set up with a PCP on Monday with [facility].  Pt states he cannot go without these two medications d/t his spasms.  Is taking baclofen 10 mg tid and reports he is taking neurontin 600 mg 1 tab po qid.  Note: Per pt's med list in chart, he is taking neurontin 600 mg 1/2 tab tid but pt states this is not correct.  Tammy, will you ok rxs for these two medications until pt gets established with PCP? Pls advise.  Thank you.      [facility] [place]s

## 2011-08-12 MED ORDER — BACLOFEN 10 MG PO TABS: 10.0000 mg | ORAL_TABLET | Freq: Three times a day (TID) | ORAL | Status: DC

## 2011-08-12 MED ORDER — GABAPENTIN 600 MG PO TABS: 600.0000 mg | ORAL_TABLET | Freq: Four times a day (QID) | ORAL | Status: DC

## 2011-08-12 NOTE — Telephone Encounter (Signed)
That is fine for 1 month only w/ no refills as these are non narcotic meds.  Must get additional refills through PCP in future.

## 2011-08-12 NOTE — Telephone Encounter (Signed)
Refills sent. Pt aware.  Anayelli Lai, CMA  

## 2011-08-17 ENCOUNTER — Ambulatory Visit: Payer: No Typology Code available for payment source | Admitting: Physical Therapy

## 2011-08-18 ENCOUNTER — Ambulatory Visit: Payer: Medicaid Other | Attending: Pain Medicine | Admitting: Physical Therapy

## 2011-08-18 DIAGNOSIS — M6281 Muscle weakness (generalized): Secondary | ICD-10-CM | POA: Insufficient documentation

## 2011-08-18 DIAGNOSIS — IMO0001 Reserved for inherently not codable concepts without codable children: Secondary | ICD-10-CM | POA: Insufficient documentation

## 2011-08-18 DIAGNOSIS — R269 Unspecified abnormalities of gait and mobility: Secondary | ICD-10-CM | POA: Insufficient documentation

## 2011-08-20 ENCOUNTER — Ambulatory Visit: Payer: No Typology Code available for payment source | Admitting: Physical Therapy

## 2011-08-21 ENCOUNTER — Ambulatory Visit: Payer: Medicaid Other | Admitting: Physical Therapy

## 2011-08-24 ENCOUNTER — Ambulatory Visit: Payer: Medicaid Other | Admitting: Physical Therapy

## 2011-08-27 ENCOUNTER — Ambulatory Visit: Payer: Medicaid Other | Admitting: Physical Therapy

## 2011-08-31 ENCOUNTER — Ambulatory Visit: Payer: No Typology Code available for payment source | Admitting: Physical Therapy

## 2011-09-01 ENCOUNTER — Ambulatory Visit: Payer: Medicaid Other | Admitting: Physical Therapy

## 2011-09-03 ENCOUNTER — Ambulatory Visit: Payer: Medicaid Other | Admitting: Physical Therapy

## 2011-09-07 ENCOUNTER — Ambulatory Visit: Payer: Medicaid Other | Admitting: Physical Therapy

## 2011-09-10 ENCOUNTER — Ambulatory Visit: Payer: Medicaid Other | Admitting: Physical Therapy

## 2011-09-16 ENCOUNTER — Ambulatory Visit: Payer: Medicaid Other | Attending: Pain Medicine | Admitting: Physical Therapy

## 2011-09-16 DIAGNOSIS — R269 Unspecified abnormalities of gait and mobility: Secondary | ICD-10-CM | POA: Insufficient documentation

## 2011-09-16 DIAGNOSIS — IMO0001 Reserved for inherently not codable concepts without codable children: Secondary | ICD-10-CM | POA: Insufficient documentation

## 2011-09-16 DIAGNOSIS — M6281 Muscle weakness (generalized): Secondary | ICD-10-CM | POA: Insufficient documentation

## 2011-09-17 ENCOUNTER — Encounter (HOSPITAL_COMMUNITY): Payer: Self-pay | Admitting: *Deleted

## 2011-09-17 ENCOUNTER — Emergency Department (HOSPITAL_COMMUNITY): Payer: Medicaid Other

## 2011-09-17 ENCOUNTER — Emergency Department (HOSPITAL_COMMUNITY)
Admission: EM | Admit: 2011-09-17 | Discharge: 2011-09-17 | Disposition: A | Discharge status: Home / Self Care | Payer: Medicaid Other | Attending: Emergency Medicine | Admitting: Emergency Medicine

## 2011-09-17 DIAGNOSIS — Z79899 Other long term (current) drug therapy: Secondary | ICD-10-CM | POA: Insufficient documentation

## 2011-09-17 DIAGNOSIS — G8220 Paraplegia, unspecified: Secondary | ICD-10-CM | POA: Insufficient documentation

## 2011-09-17 DIAGNOSIS — M546 Pain in thoracic spine: Secondary | ICD-10-CM | POA: Insufficient documentation

## 2011-09-17 DIAGNOSIS — G8929 Other chronic pain: Secondary | ICD-10-CM | POA: Insufficient documentation

## 2011-09-17 DIAGNOSIS — M79609 Pain in unspecified limb: Secondary | ICD-10-CM | POA: Insufficient documentation

## 2011-09-17 DIAGNOSIS — F3130 Bipolar disorder, current episode depressed, mild or moderate severity, unspecified: Secondary | ICD-10-CM | POA: Insufficient documentation

## 2011-09-17 HISTORY — DX: Dorsalgia, unspecified: M54.9

## 2011-09-17 HISTORY — DX: Other chronic pain: G89.29

## 2011-09-17 HISTORY — DX: Paraplegia, unspecified: G82.20

## 2011-09-17 MED ORDER — HYDROMORPHONE HCL PF 1 MG/ML IJ SOLN
1.0000 mg | Freq: Once | INTRAMUSCULAR | Status: AC
  Administered 2011-09-17: 1 mg via INTRAVENOUS
  Filled 2011-09-17: qty 1

## 2011-09-17 NOTE — ED Notes (Signed)
Patient continues in MRI at this time
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
## 2011-09-17 NOTE — ED Notes (Signed)
MD at bedside March Rummage)
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
## 2011-09-17 NOTE — Discharge Instructions (Signed)
Back Pain, Adult Low back pain is very common. About 1 in 5 people have back pain.The cause of low back pain is rarely dangerous. The pain often gets better over time.About half of people with a sudden onset of back pain feel better in just 2 weeks. About 8 in 10 people feel better by 6 weeks.  CAUSES Some common causes of back pain include:  Strain of the muscles or ligaments supporting the spine.   Wear and tear (degeneration) of the spinal discs.   Arthritis.   Direct injury to the back.  DIAGNOSIS Most of the time, the direct cause of low back pain is not known.However, back pain can be treated effectively even when the exact cause of the pain is unknown.Answering your caregiver's questions about your overall health and symptoms is one of the most accurate ways to make sure the cause of your pain is not dangerous. If your caregiver needs more information, he or she may order lab work or imaging tests (X-rays or MRIs).However, even if imaging tests show changes in your back, this usually does not require surgery. HOME CARE INSTRUCTIONS For many people, back pain returns.Since low back pain is rarely dangerous, it is often a condition that people can learn to manageon their own.   Remain active. It is stressful on the back to sit or stand in one place. Do not sit, drive, or stand in one place for more than 30 minutes at a time. Take short walks on level surfaces as soon as pain allows.Try to increase the length of time you walk each day.   Do not stay in bed.Resting more than 1 or 2 days can delay your recovery.   Do not avoid exercise or work.Your body is made to move.It is not dangerous to be active, even though your back may hurt.Your back will likely heal faster if you return to being active before your pain is gone.   Pay attention to your body when you bend and lift. Many people have less discomfortwhen lifting if they bend their knees, keep the load close to their  bodies,and avoid twisting. Often, the most comfortable positions are those that put less stress on your recovering back.   Find a comfortable position to sleep. Use a firm mattress and lie on your side with your knees slightly bent. If you lie on your back, put a pillow under your knees.   Only take over-the-counter or prescription medicines as directed by your caregiver. Over-the-counter medicines to reduce pain and inflammation are often the most helpful.Your caregiver may prescribe muscle relaxant drugs.These medicines help dull your pain so you can more quickly return to your normal activities and healthy exercise.   Put ice on the injured area.   Put ice in a plastic bag.   Place a towel between your skin and the bag.   Leave the ice on for 15 to 20 minutes, 3 to 4 times a day for the first 2 to 3 days. After that, ice and heat may be alternated to reduce pain and spasms.   Ask your caregiver about trying back exercises and gentle massage. This may be of some benefit.   Avoid feeling anxious or stressed.Stress increases muscle tension and can worsen back pain.It is important to recognize when you are anxious or stressed and learn ways to manage it.Exercise is a great option.  SEEK MEDICAL CARE IF:  You have pain that is not relieved with rest or medicine.   You have   pain that does not improve in 1 week.   You have new symptoms.   You are generally not feeling well.  SEEK IMMEDIATE MEDICAL CARE IF:   You have pain that radiates from your back into your legs.   You develop new bowel or bladder control problems.   You have unusual weakness or numbness in your arms or legs.   You develop nausea or vomiting.   You develop abdominal pain.   You feel faint.  Document Released: 05/04/2005 Document Revised: 04/23/2011 Document Reviewed: 09/22/2010 [employer] Patient Information 2012 [employer]. 

## 2011-09-18 NOTE — ED Provider Notes (Signed)
I saw and evaluated the patient, reviewed the resident's note and I agree with the findings and plan.  t5 paraplegia, acute on chronic back pain at site of surgery. No change in weakness and paresthesias. Chronic urinary retention and stool incontinence.   TTP at surgical scar. RLE weakness with clonus  Glynn Octave, MD 09/18/11 [phone]

## 2011-09-23 ENCOUNTER — Ambulatory Visit: Payer: Medicaid Other | Admitting: Physical Therapy

## 2011-09-30 ENCOUNTER — Ambulatory Visit: Payer: No Typology Code available for payment source | Admitting: Physical Therapy

## 2011-10-07 ENCOUNTER — Ambulatory Visit: Payer: Medicaid Other | Admitting: Physical Therapy

## 2011-10-14 ENCOUNTER — Ambulatory Visit: Payer: No Typology Code available for payment source | Admitting: Physical Therapy

## 2011-10-15 ENCOUNTER — Encounter: Payer: Self-pay | Admitting: Gastroenterology

## 2011-10-15 ENCOUNTER — Emergency Department (HOSPITAL_COMMUNITY): Payer: Medicaid Other

## 2011-10-15 ENCOUNTER — Emergency Department (HOSPITAL_COMMUNITY)
Admission: EM | Admit: 2011-10-15 | Discharge: 2011-10-15 | Disposition: A | Discharge status: Home / Self Care | Payer: Medicaid Other | Attending: Emergency Medicine | Admitting: Emergency Medicine

## 2011-10-15 ENCOUNTER — Encounter (HOSPITAL_COMMUNITY): Payer: Self-pay

## 2011-10-15 DIAGNOSIS — I10 Essential (primary) hypertension: Secondary | ICD-10-CM | POA: Insufficient documentation

## 2011-10-15 DIAGNOSIS — Z79899 Other long term (current) drug therapy: Secondary | ICD-10-CM | POA: Insufficient documentation

## 2011-10-15 DIAGNOSIS — G8929 Other chronic pain: Secondary | ICD-10-CM | POA: Insufficient documentation

## 2011-10-15 DIAGNOSIS — M549 Dorsalgia, unspecified: Secondary | ICD-10-CM | POA: Insufficient documentation

## 2011-10-15 DIAGNOSIS — R109 Unspecified abdominal pain: Secondary | ICD-10-CM | POA: Insufficient documentation

## 2011-10-15 DIAGNOSIS — K5900 Constipation, unspecified: Secondary | ICD-10-CM | POA: Insufficient documentation

## 2011-10-15 DIAGNOSIS — F3130 Bipolar disorder, current episode depressed, mild or moderate severity, unspecified: Secondary | ICD-10-CM | POA: Insufficient documentation

## 2011-10-15 HISTORY — DX: Essential (primary) hypertension: I10

## 2011-10-15 MED ORDER — SODIUM CHLORIDE 0.9 % IV BOLUS (SEPSIS): 1000.0000 mL | Freq: Once | INTRAVENOUS | Status: DC

## 2011-10-15 MED ORDER — LACTULOSE 20 GM/30ML PO SOLN: 30.0000 mL | Freq: Two times a day (BID) | ORAL | Status: DC

## 2011-10-15 MED ORDER — LACTULOSE 10 GM/15ML PO SOLN
20.0000 g | Freq: Once | ORAL | Status: AC
  Administered 2011-10-15: 20 g via ORAL
  Filled 2011-10-15: qty 30

## 2011-10-15 MED ORDER — GLYCERIN (LAXATIVE) 2.1 G RE SUPP
1.0000 | Freq: Once | RECTAL | Status: AC
  Administered 2011-10-15: 1 via RECTAL
  Filled 2011-10-15: qty 1

## 2011-10-15 NOTE — ED Notes (Signed)
Patient is aware of provider order for IV and fluids and refused to allow two RN's to obtain IV access. Thayer Ohm, [place], is aware of patient refusal.

## 2011-10-15 NOTE — Discharge Instructions (Signed)
Your x-rays do not show any signs of impaction. return here as needed for any worsening in her condition.  Followup with your doctor for recheck
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
## 2011-10-15 NOTE — ED Provider Notes (Signed)
Medical screening examination/treatment/procedure(s) were performed by non-physician practitioner and as supervising physician I was immediately available for consultation/collaboration.  Ethelda Chick, MD 10/15/11 1345

## 2011-10-15 NOTE — ED Notes (Signed)
Called pharmacy stated will verify medication and send to [facility].

## 2011-10-21 ENCOUNTER — Ambulatory Visit: Payer: Medicaid Other | Attending: Pain Medicine | Admitting: Physical Therapy

## 2011-10-21 DIAGNOSIS — M6281 Muscle weakness (generalized): Secondary | ICD-10-CM | POA: Insufficient documentation

## 2011-10-21 DIAGNOSIS — R269 Unspecified abnormalities of gait and mobility: Secondary | ICD-10-CM | POA: Insufficient documentation

## 2011-10-21 DIAGNOSIS — IMO0001 Reserved for inherently not codable concepts without codable children: Secondary | ICD-10-CM | POA: Insufficient documentation

## 2011-10-28 ENCOUNTER — Ambulatory Visit: Payer: Self-pay | Admitting: Physical Therapy

## 2011-11-02 ENCOUNTER — Ambulatory Visit: Payer: Medicaid Other | Admitting: Gastroenterology

## 2011-11-16 ENCOUNTER — Ambulatory Visit: Payer: Self-pay | Admitting: Physical Therapy

## 2011-11-25 ENCOUNTER — Ambulatory Visit (INDEPENDENT_AMBULATORY_CARE_PROVIDER_SITE_OTHER): Payer: Self-pay | Admitting: Gastroenterology

## 2011-11-25 ENCOUNTER — Encounter: Payer: Self-pay | Admitting: Gastroenterology

## 2011-11-25 VITALS — BP 118/84 | HR 80

## 2011-11-25 DIAGNOSIS — K625 Hemorrhage of anus and rectum: Secondary | ICD-10-CM

## 2011-11-25 DIAGNOSIS — K5900 Constipation, unspecified: Secondary | ICD-10-CM

## 2011-11-25 MED ORDER — MOVIPREP 100 G PO SOLR: 1.0000 | ORAL | Status: DC
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
## 2011-11-25 NOTE — Patient Instructions (Addendum)
You have been given a separate informational sheet regarding your tobacco use, the importance of quitting and local resources to help you quit. One of your biggest health concerns is your smoking.  This increases your risk for most cancers and serious cardiovascular diseases such as strokes, heart attacks.  You should try your best to stop.  If you need assistance, please contact your PCP or Smoking Cessation Class at [facility] [phone]) or [facility] Quit-Line (1-800-QUIT-NOW). You will be set up for a colonoscopy with MAC sedation at [facility] hosp for constipation, bleeding. You should try fleets enema every other day, continuously as a bowel regimen.

## 2011-12-24 ENCOUNTER — Encounter (HOSPITAL_COMMUNITY): Admission: RE | Payer: Self-pay | Source: Ambulatory Visit

## 2011-12-24 ENCOUNTER — Ambulatory Visit (HOSPITAL_COMMUNITY): Admission: RE | Admit: 2011-12-24 | Payer: Medicaid Other | Source: Ambulatory Visit | Admitting: Gastroenterology

## 2011-12-24 ENCOUNTER — Telehealth: Payer: Self-pay | Admitting: Gastroenterology

## 2011-12-24 SURGERY — COLONOSCOPY WITH PROPOFOL
Anesthesia: Monitor Anesthesia Care

## 2011-12-24 NOTE — Telephone Encounter (Signed)
Please let him know that we cannot reschedule this procedure unless he is seen again in my office.

## 2011-12-24 NOTE — Telephone Encounter (Signed)
Pt.notified

## 2011-12-24 NOTE — Telephone Encounter (Signed)
Pt called and said that he spoke with 2 different people at the hospital to cx his procedure.  He does not feel like he needs to have this done because after starting stool softeners he has not had any problems.  He will call back if his symptoms return.  I did advise him that its important to have this procedure to rule out any potential cancers or infections.

## 2012-04-04 DIAGNOSIS — R269 Unspecified abnormalities of gait and mobility: Secondary | ICD-10-CM | POA: Insufficient documentation

## 2012-08-16 ENCOUNTER — Other Ambulatory Visit: Payer: Self-pay

## 2012-08-16 MED ORDER — OXYCODONE HCL 5 MG PO TABS: 5.0000 mg | ORAL_TABLET | Freq: Four times a day (QID) | ORAL | Status: DC | PRN

## 2012-08-16 MED ORDER — MORPHINE SULFATE ER 30 MG PO TBCR: 30.0000 mg | EXTENDED_RELEASE_TABLET | Freq: Two times a day (BID) | ORAL | Status: DC

## 2012-08-16 NOTE — Telephone Encounter (Signed)
Patient called clinic requesting refill on both pain meds.

## 2012-09-14 ENCOUNTER — Other Ambulatory Visit: Payer: Self-pay

## 2012-09-14 MED ORDER — OXYCODONE HCL 5 MG PO TABS: 5.0000 mg | ORAL_TABLET | Freq: Four times a day (QID) | ORAL | Status: DC | PRN

## 2012-09-14 MED ORDER — MORPHINE SULFATE ER 30 MG PO TBCR: 30.0000 mg | EXTENDED_RELEASE_TABLET | Freq: Two times a day (BID) | ORAL | Status: DC

## 2012-09-14 NOTE — Telephone Encounter (Signed)
Patient called to request refill on pain med.

## 2012-09-27 ENCOUNTER — Other Ambulatory Visit: Payer: Self-pay

## 2012-09-27 MED ORDER — DIAZEPAM 5 MG PO TABS: 5.0000 mg | ORAL_TABLET | Freq: Three times a day (TID) | ORAL | Status: DC

## 2012-09-27 NOTE — Telephone Encounter (Signed)
Patent called requesting refills on Diazepam.

## 2012-10-03 ENCOUNTER — Encounter: Payer: Self-pay | Admitting: Neurology

## 2012-10-03 ENCOUNTER — Ambulatory Visit (INDEPENDENT_AMBULATORY_CARE_PROVIDER_SITE_OTHER): Payer: Self-pay | Admitting: Neurology

## 2012-10-03 VITALS — BP 144/92 | HR 88 | Wt 190.0 lb

## 2012-10-03 DIAGNOSIS — R269 Unspecified abnormalities of gait and mobility: Secondary | ICD-10-CM

## 2012-10-03 DIAGNOSIS — G8220 Paraplegia, unspecified: Secondary | ICD-10-CM

## 2012-10-03 DIAGNOSIS — M4714 Other spondylosis with myelopathy, thoracic region: Secondary | ICD-10-CM | POA: Insufficient documentation

## 2012-10-03 HISTORY — DX: Other spondylosis with myelopathy, thoracic region: M47.14

## 2012-10-03 MED ORDER — DIAZEPAM 5 MG PO TABS: ORAL_TABLET | ORAL | Status: DC
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
## 2012-10-12 ENCOUNTER — Other Ambulatory Visit: Payer: Self-pay | Admitting: *Deleted

## 2012-10-12 MED ORDER — LISINOPRIL 10 MG PO TABS: 10.0000 mg | ORAL_TABLET | Freq: Every day | ORAL | Status: DC

## 2012-10-12 MED ORDER — MORPHINE SULFATE ER 30 MG PO TBCR: 30.0000 mg | EXTENDED_RELEASE_TABLET | Freq: Two times a day (BID) | ORAL | Status: DC

## 2012-10-12 MED ORDER — OXYCODONE HCL 5 MG PO TABS: 5.0000 mg | ORAL_TABLET | Freq: Four times a day (QID) | ORAL | Status: DC | PRN
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
## 2012-10-12 NOTE — Addendum Note (Signed)
Addended by: Stephanie Acre on: 10/12/2012 03:09 PM   Modules accepted: Orders

## 2012-10-12 NOTE — Telephone Encounter (Signed)
Patient called requesting refills on pain meds.  He would like to pick them up when ready.  Call back number [phone].

## 2012-10-12 NOTE — Telephone Encounter (Signed)
I will call in a prescription for this medication.

## 2012-10-13 NOTE — Telephone Encounter (Signed)
I called patient.  Got no answer, VM not set up.  Unable to leave message.

## 2012-11-09 ENCOUNTER — Other Ambulatory Visit: Payer: Self-pay | Admitting: Neurology

## 2012-11-09 MED ORDER — OXYCODONE HCL 5 MG PO TABS: 5.0000 mg | ORAL_TABLET | Freq: Four times a day (QID) | ORAL | Status: DC | PRN

## 2012-11-09 MED ORDER — MORPHINE SULFATE ER 30 MG PO TBCR: 30.0000 mg | EXTENDED_RELEASE_TABLET | Freq: Two times a day (BID) | ORAL | Status: DC

## 2012-11-09 NOTE — Telephone Encounter (Signed)
Dr Anne Hahn is out of the office, forwarding request to Dr Vickey Huger, [employer]

## 2012-12-07 ENCOUNTER — Other Ambulatory Visit: Payer: Self-pay | Admitting: Neurology

## 2012-12-07 MED ORDER — OXYCODONE HCL 5 MG PO TABS: 5.0000 mg | ORAL_TABLET | Freq: Four times a day (QID) | ORAL | Status: DC | PRN

## 2012-12-07 MED ORDER — MORPHINE SULFATE ER 30 MG PO TBCR: 30.0000 mg | EXTENDED_RELEASE_TABLET | Freq: Two times a day (BID) | ORAL | Status: DC

## 2013-01-04 ENCOUNTER — Other Ambulatory Visit: Payer: Self-pay | Admitting: Neurology

## 2013-01-04 MED ORDER — MORPHINE SULFATE ER 30 MG PO TBCR: 30.0000 mg | EXTENDED_RELEASE_TABLET | Freq: Two times a day (BID) | ORAL | Status: DC

## 2013-01-04 MED ORDER — OXYCODONE HCL 5 MG PO TABS: 5.0000 mg | ORAL_TABLET | Freq: Four times a day (QID) | ORAL | Status: DC | PRN

## 2013-01-04 NOTE — Telephone Encounter (Signed)
Pt called needs refill for his oxyCODONE (OXY IR/ROXICODONE) 5 MG immediate release tablet and morphine (MS CONTIN) 30 MG 12 hr tablet. Thanks

## 2013-01-05 ENCOUNTER — Telehealth: Payer: Self-pay | Admitting: Neurology

## 2013-01-05 NOTE — Telephone Encounter (Signed)
Rx signed, ready for pick up.  I spoke with the patient.  He is aware.

## 2013-01-05 NOTE — Telephone Encounter (Signed)
Duplicate message. 

## 2013-02-02 ENCOUNTER — Other Ambulatory Visit: Payer: Self-pay

## 2013-02-02 MED ORDER — MORPHINE SULFATE ER 30 MG PO TBCR: 30.0000 mg | EXTENDED_RELEASE_TABLET | Freq: Two times a day (BID) | ORAL | Status: DC

## 2013-02-02 MED ORDER — OXYCODONE HCL 5 MG PO TABS: 5.0000 mg | ORAL_TABLET | Freq: Four times a day (QID) | ORAL | Status: DC | PRN

## 2013-02-02 NOTE — Telephone Encounter (Signed)
Patient called requesting a refill on Oxycodone and Morphine.  He would like to pick up the Rx's when they are ready.  Call back number [phone].  Dr Anne Hahn is out of the office, forwarding request to Dr Terrace Arabia, [facility]

## 2013-02-03 NOTE — Telephone Encounter (Signed)
Rx signed, ready for pick up.  I called the patient, phone rang several times with no answer.  A message played saying the subscriber is not available.  No option to leave VM.

## 2013-03-02 ENCOUNTER — Other Ambulatory Visit: Payer: Self-pay

## 2013-03-02 MED ORDER — OXYCODONE HCL 5 MG PO TABS: 5.0000 mg | ORAL_TABLET | Freq: Four times a day (QID) | ORAL | Status: DC | PRN

## 2013-03-02 MED ORDER — MORPHINE SULFATE ER 30 MG PO TBCR: 30.0000 mg | EXTENDED_RELEASE_TABLET | Freq: Two times a day (BID) | ORAL | Status: DC

## 2013-03-02 NOTE — Telephone Encounter (Signed)
Patient called requesting a refill on Morphine and Oxycodone.  He would like to pick them up when they are ready.  Call back number [phone].

## 2013-03-03 NOTE — Telephone Encounter (Signed)
Rx's signed, ready for pick up.  I called the patient, he is aware.

## 2013-03-09 ENCOUNTER — Emergency Department (HOSPITAL_COMMUNITY)
Admission: EM | Admit: 2013-03-09 | Discharge: 2013-03-09 | Disposition: A | Discharge status: Home / Self Care | Payer: Medicaid Other | Attending: Emergency Medicine | Admitting: Emergency Medicine

## 2013-03-09 DIAGNOSIS — Z79899 Other long term (current) drug therapy: Secondary | ICD-10-CM | POA: Insufficient documentation

## 2013-03-09 DIAGNOSIS — Z87442 Personal history of urinary calculi: Secondary | ICD-10-CM | POA: Insufficient documentation

## 2013-03-09 DIAGNOSIS — K5900 Constipation, unspecified: Secondary | ICD-10-CM | POA: Insufficient documentation

## 2013-03-09 DIAGNOSIS — I10 Essential (primary) hypertension: Secondary | ICD-10-CM | POA: Insufficient documentation

## 2013-03-09 DIAGNOSIS — G8929 Other chronic pain: Secondary | ICD-10-CM | POA: Insufficient documentation

## 2013-03-09 DIAGNOSIS — F17200 Nicotine dependence, unspecified, uncomplicated: Secondary | ICD-10-CM | POA: Insufficient documentation

## 2013-03-09 DIAGNOSIS — R110 Nausea: Secondary | ICD-10-CM | POA: Insufficient documentation

## 2013-03-09 DIAGNOSIS — Z8739 Personal history of other diseases of the musculoskeletal system and connective tissue: Secondary | ICD-10-CM | POA: Insufficient documentation

## 2013-03-09 DIAGNOSIS — Z8781 Personal history of (healed) traumatic fracture: Secondary | ICD-10-CM | POA: Insufficient documentation

## 2013-03-09 DIAGNOSIS — F411 Generalized anxiety disorder: Secondary | ICD-10-CM | POA: Insufficient documentation

## 2013-03-09 MED ORDER — POLYETHYLENE GLYCOL 3350 17 GM/SCOOP PO POWD
1.0000 | Freq: Once | ORAL | Status: DC
  Filled 2013-03-09: qty 255
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
## 2013-03-09 NOTE — ED Notes (Signed)
Dr. Silverio Lay ordered polyethylene glycol and enema for pt; pt refused after having a large bowel movement.  Dr. Silverio Lay notified.

## 2013-03-09 NOTE — Discharge Instructions (Signed)
Increase miralax to 2-3 times a day.   Continue your current bowel regimen.   Follow up with your GI doctor.   Return to ER if you have severe pain, vomiting, not passing gas.
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
## 2013-03-09 NOTE — ED Notes (Signed)
Pt reports taking 6 stool softners, suppository and enema every day for BM, but reports no relief x 13 days. Pt has no sensation to abdomen, so unable to assess pain. Pt firm to touch. Hx of the constipation. No impaction noted by MD Silverio Lay.

## 2013-03-11 ENCOUNTER — Telehealth: Payer: Self-pay | Admitting: Diagnostic Neuroimaging

## 2013-03-11 MED ORDER — BACLOFEN 20 MG PO TABS: 20.0000 mg | ORAL_TABLET | Freq: Three times a day (TID) | ORAL | Status: DC

## 2013-03-11 NOTE — Telephone Encounter (Signed)
Patient called on weekend. Ran out of baclofen with no refills. Refill for 1 month sent in. Asked patient to touch base with Dr. Anne Hahn next week by phone.  Suanne Marker, MD 03/11/2013, 2:07 PM Certified in Neurology, Neurophysiology and Neuroimaging  [facility] [address], [address] [place], [place] [address] [phone]

## 2013-03-13 ENCOUNTER — Other Ambulatory Visit: Payer: Self-pay | Admitting: Diagnostic Neuroimaging

## 2013-03-13 ENCOUNTER — Other Ambulatory Visit: Payer: Self-pay | Admitting: Neurology

## 2013-03-13 MED ORDER — BACLOFEN 10 MG PO TABS: 20.0000 mg | ORAL_TABLET | Freq: Three times a day (TID) | ORAL | Status: DC

## 2013-03-15 NOTE — Telephone Encounter (Signed)
PER LAST OV: The patient will remain on baclofen at 20 mg 3 times daily Dr Marjory Lies already sent this Rx on 10/25, however the patient claims the pharmacy did not receive it.

## 2013-03-31 ENCOUNTER — Telehealth: Payer: Self-pay | Admitting: Neurology

## 2013-03-31 ENCOUNTER — Other Ambulatory Visit: Payer: Self-pay | Admitting: Neurology

## 2013-03-31 MED ORDER — MORPHINE SULFATE ER 30 MG PO TBCR: 30.0000 mg | EXTENDED_RELEASE_TABLET | Freq: Two times a day (BID) | ORAL | Status: DC

## 2013-03-31 MED ORDER — OXYCODONE HCL 5 MG PO TABS: 5.0000 mg | ORAL_TABLET | Freq: Four times a day (QID) | ORAL | Status: DC | PRN

## 2013-03-31 NOTE — Telephone Encounter (Signed)
Called patient to let him know that his prescription was ready to be picked up and patient verbalized understanding.

## 2013-04-04 NOTE — Telephone Encounter (Signed)
I have not had access to Epic for over 2 weeks.  By viewing the chart, it appears this was already taken care of.   

## 2013-04-05 ENCOUNTER — Encounter (INDEPENDENT_AMBULATORY_CARE_PROVIDER_SITE_OTHER): Payer: Self-pay

## 2013-04-05 ENCOUNTER — Ambulatory Visit (INDEPENDENT_AMBULATORY_CARE_PROVIDER_SITE_OTHER): Payer: Self-pay | Admitting: Neurology

## 2013-04-05 ENCOUNTER — Encounter: Payer: Self-pay | Admitting: Neurology

## 2013-04-05 VITALS — BP 128/88 | HR 80 | Wt 195.0 lb

## 2013-04-05 DIAGNOSIS — R269 Unspecified abnormalities of gait and mobility: Secondary | ICD-10-CM

## 2013-04-05 DIAGNOSIS — M4714 Other spondylosis with myelopathy, thoracic region: Secondary | ICD-10-CM

## 2013-04-05 MED ORDER — DIAZEPAM 5 MG PO TABS: ORAL_TABLET | ORAL | Status: DC

## 2013-04-05 MED ORDER — BACLOFEN 20 MG PO TABS: ORAL_TABLET | ORAL | Status: DC

## 2013-04-05 NOTE — Patient Instructions (Signed)
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
## 2013-04-22 ENCOUNTER — Telehealth: Payer: Self-pay | Admitting: Urology

## 2013-04-22 DIAGNOSIS — N390 Urinary tract infection, site not specified: Secondary | ICD-10-CM

## 2013-04-22 NOTE — Telephone Encounter (Signed)
Patient with self-reported UTI after catheterizing.  Macrobid called in.

## 2013-04-28 ENCOUNTER — Other Ambulatory Visit: Payer: Self-pay

## 2013-04-28 ENCOUNTER — Telehealth: Payer: Self-pay | Admitting: *Deleted

## 2013-04-28 MED ORDER — OXYCODONE HCL 5 MG PO TABS: 5.0000 mg | ORAL_TABLET | Freq: Four times a day (QID) | ORAL | Status: DC | PRN

## 2013-04-28 MED ORDER — MORPHINE SULFATE ER 30 MG PO TBCR: 30.0000 mg | EXTENDED_RELEASE_TABLET | Freq: Two times a day (BID) | ORAL | Status: DC

## 2013-04-28 NOTE — Telephone Encounter (Signed)
Duplicate message.  Patients request was already sent to provider for approval.

## 2013-04-28 NOTE — Telephone Encounter (Signed)
Patient called requesting refills on Morphine and Oxycodone.  He would like to pick up the Rx's when they are ready.  Call back number [phone].

## 2013-05-22 ENCOUNTER — Telehealth: Payer: Self-pay | Admitting: Neurology

## 2013-05-22 MED ORDER — DIAZEPAM 10 MG PO TABS: 10.0000 mg | ORAL_TABLET | Freq: Three times a day (TID) | ORAL | Status: DC

## 2013-05-22 MED ORDER — NORTRIPTYLINE HCL 25 MG PO CAPS: ORAL_CAPSULE | ORAL | Status: DC

## 2013-05-22 NOTE — Telephone Encounter (Signed)
Patient is having complications with constant back spasms and would like a call back from the nurse. Patient is on baclofen and it does not seem to be working. Patient stated that he does not have insurance right now but will have in July. GB

## 2013-05-22 NOTE — Telephone Encounter (Signed)
I called patient. The patient indicates that his spasms are ongoing, and the pain level is increasing. The patient wants to go up on his opiate medications, I do not wish to do this. I will increase the diazepam taking 10 mg 3 times daily, place him on nortriptyline at nighttime for sleep and for pain.

## 2013-05-23 ENCOUNTER — Telehealth: Payer: Self-pay | Admitting: Neurology

## 2013-05-23 ENCOUNTER — Other Ambulatory Visit: Payer: Self-pay

## 2013-05-23 DIAGNOSIS — S24102S Unspecified injury at T2-T6 level of thoracic spinal cord, sequela: Secondary | ICD-10-CM

## 2013-05-23 NOTE — Telephone Encounter (Signed)
Pt called again @ 11:07 am.  States someone called from our office but doesn't know what it is regarding.  He also wants a referral to see Dr. Nunn in [place], [place] for pain management. Please call.  Thank you.       

## 2013-05-23 NOTE — Telephone Encounter (Signed)
Rx faxed to [facility]. Attempted to leave VM for pt but phone service not working.

## 2013-05-23 NOTE — Telephone Encounter (Signed)
I called patient. The patient wants a referral to a pain Center, I will get this set up.

## 2013-05-23 NOTE — Telephone Encounter (Signed)
Pt called again @ 11:07 am.  States someone called from our office but doesn't know what it is regarding.  He also wants a referral to see Dr. Fortino [place]unn in [place] for pain management. Please call.  Thank you.

## 2013-05-23 NOTE — Telephone Encounter (Signed)
Patient called and states that he received a missed call from our office about 15 minutes ago and is wondering what it is regarding. Please call the patient back.

## 2013-05-24 ENCOUNTER — Telehealth: Payer: Self-pay | Admitting: Neurology

## 2013-05-24 MED ORDER — OXYCODONE HCL 10 MG PO TABS: 10.0000 mg | ORAL_TABLET | Freq: Four times a day (QID) | ORAL | Status: DC | PRN

## 2013-05-24 NOTE — Telephone Encounter (Signed)
Patient called requesting a call from Dr. Anne HahnWillis, was vague about what it was concerning and declined to go into detail, just saying that he is having "super complications." Please call the patient.

## 2013-05-24 NOTE — Telephone Encounter (Signed)
I called the patient. The patient once again indicates that he is in severe pain. He is unable to function physically. We will keep the MS contin dose the same. We will consider a pain center referral once he gets insurance.

## 2013-05-25 ENCOUNTER — Telehealth: Payer: Self-pay | Admitting: Neurology

## 2013-05-25 ENCOUNTER — Encounter: Payer: Self-pay | Admitting: *Deleted

## 2013-05-25 NOTE — Telephone Encounter (Signed)
I called the patient. The patient is to start nortriptyline, and he will be increased on the diazepam. The baclofen dose will be unchanged. The patient has gotten a prescription for the increased dose of the oxycodone. I discussed this with him.

## 2013-05-25 NOTE — Telephone Encounter (Signed)
Pt called back I advised him Dr. Stann MainlandWill call him back.

## 2013-05-25 NOTE — Telephone Encounter (Signed)
I called patient, the patient did not answer. I will call back later.

## 2013-05-25 NOTE — Telephone Encounter (Signed)
Pt called this morning looking for an Oxycodone RX that was promised for him when the doors open this morning also he states he needs someone to call him and explain all the medication changes. States there have been changes to about four of his medications and he is kinda confused about how he should be taking them

## 2013-05-25 NOTE — Telephone Encounter (Signed)
Please advise 

## 2013-05-26 ENCOUNTER — Telehealth: Payer: Self-pay | Admitting: Neurology

## 2013-05-26 ENCOUNTER — Other Ambulatory Visit: Payer: Self-pay

## 2013-05-26 MED ORDER — MORPHINE SULFATE ER 30 MG PO TBCR: 30.0000 mg | EXTENDED_RELEASE_TABLET | Freq: Two times a day (BID) | ORAL | Status: DC

## 2013-05-26 NOTE — Telephone Encounter (Signed)
Patient called requesting a refill on Morphine.  He would like to pick up the Rx when it's ready.  Call back number [phone]

## 2013-05-26 NOTE — Telephone Encounter (Signed)
NEEDS WRITTEN RX FOR MORPHINE SULFUR 30MG  #60--WILL NOT HAVE PILLS FOR MONDAY

## 2013-05-26 NOTE — Telephone Encounter (Signed)
Duplicate message.  Vikki PortsValerie or Lynden AngCathy will call patient when Rx is ready.

## 2013-06-22 ENCOUNTER — Other Ambulatory Visit: Payer: Self-pay | Admitting: Neurology

## 2013-06-22 MED ORDER — OXYCODONE HCL 10 MG PO TABS: 10.0000 mg | ORAL_TABLET | Freq: Four times a day (QID) | ORAL | Status: DC | PRN

## 2013-06-22 MED ORDER — MORPHINE SULFATE ER 30 MG PO TBCR: 30.0000 mg | EXTENDED_RELEASE_TABLET | Freq: Two times a day (BID) | ORAL | Status: DC

## 2013-06-22 NOTE — Telephone Encounter (Signed)
Pt called stating he needs refills on his Oxycodone & his MS Contin prescriptions.  Please call when they are ready.  Thank you.

## 2013-06-22 NOTE — Telephone Encounter (Signed)
Called patient to inform him that his Rx was ready to be picked up at the front desk and if he has any other problems, questions or concerns to call the office. Patient verbalized understanding. °

## 2013-07-19 ENCOUNTER — Other Ambulatory Visit: Payer: Self-pay | Admitting: Neurology

## 2013-07-19 MED ORDER — MORPHINE SULFATE ER 30 MG PO TBCR: 30.0000 mg | EXTENDED_RELEASE_TABLET | Freq: Two times a day (BID) | ORAL | Status: DC

## 2013-07-19 MED ORDER — OXYCODONE HCL 10 MG PO TABS: 10.0000 mg | ORAL_TABLET | Freq: Four times a day (QID) | ORAL | Status: DC | PRN

## 2013-07-19 NOTE — Telephone Encounter (Signed)
Called patient to inform him that his Rx was ready to be picked up at the front desk and if he has any other problems, questions or concerns to call the office. Patient verbalized understanding. °

## 2013-07-19 NOTE — Telephone Encounter (Signed)
Pt called is wanting to get his written prescription for Oxycodone HCl 10 MG TABS and morphine (MS CONTIN) 30 MG 12 hr tablet and would like for someone to call him back when this is ready for pick up.

## 2013-08-12 IMAGING — CR DG CHEST 1V PORT
1 series · 1 of 1 positions shown · non-contrast
Comparison: the previous day's study

CLINICAL DATA: Pneumonia

PORTABLE CHEST - 1 VIEW

[view not recorded]
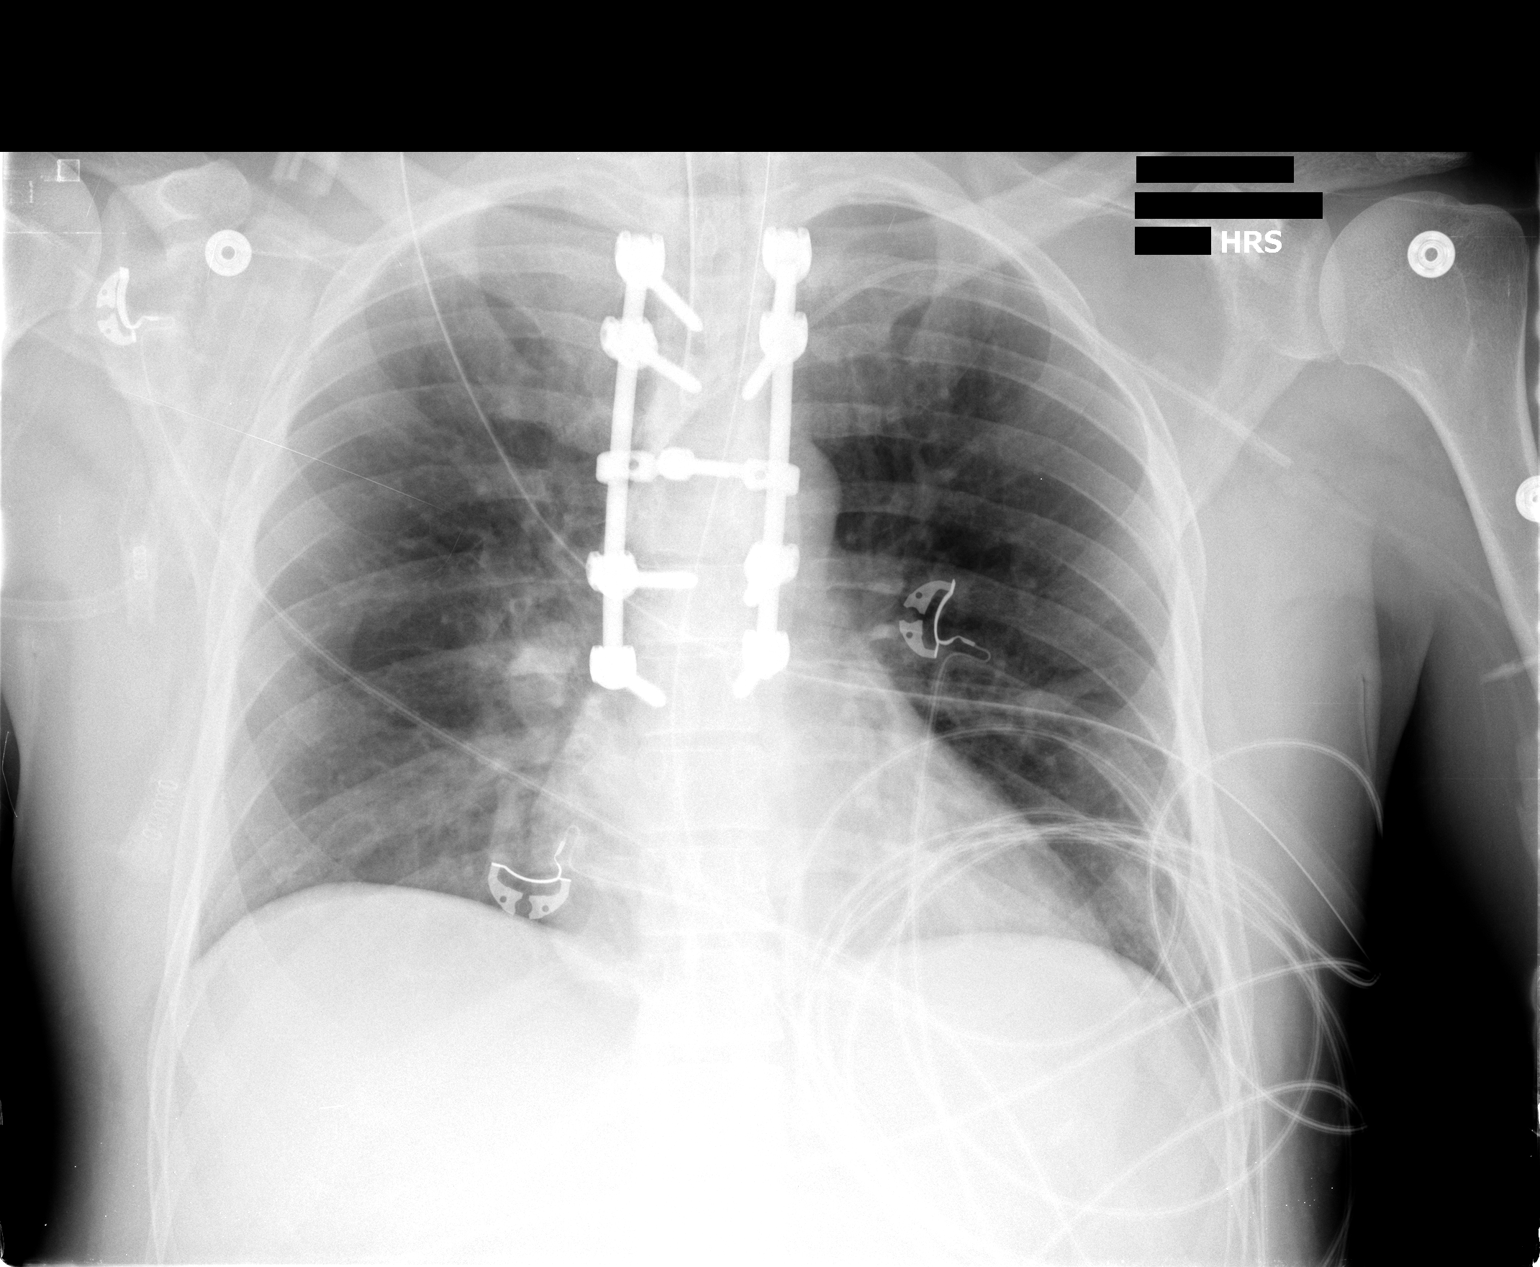

[1 of 1 positions shown; findings below may reference images not displayed]

FINDINGS: Endotracheal tube, nasogastric tube, and left subclavian
central line remain in place.  Thoracic spinal fusion hardware
stable.  Slightly improved aeration with further improvement in
perihilar vascular congestion.  No effusion.
IMPRESSION: 1.  Improving perihilar vascular congestion.
2. Support hardware stable in position.

## 2013-08-16 ENCOUNTER — Other Ambulatory Visit: Payer: Self-pay | Admitting: Neurology

## 2013-08-16 MED ORDER — OXYCODONE HCL 10 MG PO TABS: 10.0000 mg | ORAL_TABLET | Freq: Four times a day (QID) | ORAL | Status: DC | PRN

## 2013-08-16 MED ORDER — MORPHINE SULFATE ER 30 MG PO TBCR: 30.0000 mg | EXTENDED_RELEASE_TABLET | Freq: Two times a day (BID) | ORAL | Status: DC

## 2013-08-16 NOTE — Telephone Encounter (Signed)
Pt called is wanting to get his written prescription for Oxycodone HCl 10 MG TABS and morphine (MS CONTIN) 30 MG 12 hr tablet and would like for someone to call him back when this is ready for pick up.

## 2013-08-17 NOTE — Telephone Encounter (Signed)
Called pt and left message informing him that his Rx was ready to be picked up at the front desk and if he has any other problems, questions or concerns to call the office. °

## 2013-09-13 ENCOUNTER — Other Ambulatory Visit: Payer: Self-pay | Admitting: *Deleted

## 2013-09-13 MED ORDER — OXYCODONE HCL 10 MG PO TABS: 10.0000 mg | ORAL_TABLET | Freq: Four times a day (QID) | ORAL | Status: DC | PRN

## 2013-09-13 MED ORDER — MORPHINE SULFATE ER 30 MG PO TBCR: 30.0000 mg | EXTENDED_RELEASE_TABLET | Freq: Two times a day (BID) | ORAL | Status: DC

## 2013-09-13 NOTE — Telephone Encounter (Signed)
Patient calling for refill of Oxycodone HCl 10 MG TABS and morphine (MS CONTIN) 30 MG 12 hr tablet.  Please call patient when ready for pick up.  Thanks

## 2013-09-13 NOTE — Telephone Encounter (Signed)
Called pt's parent and left a message informing them that the pt's Rx was ready to be picked up at the front desk and if the pt has any other problems, questions or concerns to call the office.

## 2013-09-13 NOTE — Telephone Encounter (Signed)
Request forwarded to provider for approval  

## 2013-10-10 ENCOUNTER — Ambulatory Visit (INDEPENDENT_AMBULATORY_CARE_PROVIDER_SITE_OTHER): Payer: Self-pay | Admitting: Neurology

## 2013-10-10 ENCOUNTER — Encounter (INDEPENDENT_AMBULATORY_CARE_PROVIDER_SITE_OTHER): Payer: Self-pay

## 2013-10-10 ENCOUNTER — Encounter: Payer: Self-pay | Admitting: Neurology

## 2013-10-10 VITALS — BP 137/93 | HR 93 | Wt 200.0 lb

## 2013-10-10 DIAGNOSIS — F329 Major depressive disorder, single episode, unspecified: Secondary | ICD-10-CM | POA: Insufficient documentation

## 2013-10-10 DIAGNOSIS — M4714 Other spondylosis with myelopathy, thoracic region: Secondary | ICD-10-CM

## 2013-10-10 DIAGNOSIS — F32A Depression, unspecified: Secondary | ICD-10-CM | POA: Insufficient documentation

## 2013-10-10 DIAGNOSIS — R269 Unspecified abnormalities of gait and mobility: Secondary | ICD-10-CM

## 2013-10-10 DIAGNOSIS — F3289 Other specified depressive episodes: Secondary | ICD-10-CM

## 2013-10-10 MED ORDER — MORPHINE SULFATE ER 30 MG PO TBCR: 30.0000 mg | EXTENDED_RELEASE_TABLET | Freq: Two times a day (BID) | ORAL | Status: DC

## 2013-10-10 MED ORDER — OXYCODONE HCL 10 MG PO TABS: 10.0000 mg | ORAL_TABLET | Freq: Four times a day (QID) | ORAL | Status: DC | PRN

## 2013-10-10 NOTE — Patient Instructions (Signed)

## 2013-10-10 NOTE — Progress Notes (Signed)
Reason for visit: Paraparesis  Jason Dominguez is an 33 y.o. 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 The patient has spasticity in the legs, right greater than left, and weakness in the legs, or right greater than left. The patient walks with walker, but he has a significant degree of spasticity involving the right leg associated with clonus with weightbearing. The patient has some problems controlling the bladder, and he occasionally will require in and out catheterization. The patient has significant hemorrhoids, and otherwise she feels that his bowels are working fairly well. The patient wears adult diapers. The patient is having ongoing problems with depression. He has ongoing chronic pain, and he takes MS Contin with the use of oxycodone for breakthrough pain. The patient follows up for an evaluation.  Past Medical History  Diagnosis Date  . Bipolar 1 disorder, depressed   . Drug abuse   . Alcohol abuse   . 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 Chronic back pain   . Hypertension   . Anxiety   . Depression   . History of kidney stones   . Gait disorder   . Thoracic spondylosis with myelopathy 10/03/2012    Past Surgical History  Procedure Laterality Date  . Back surgery      T4 spinal fusion with Harrington rods  . Hernia repair      stomach per pt  . Mandible fracture surgery      Cosmetic     History reviewed. No pertinent family history.  Social history:  reports that he has been smoking Cigarettes.  He has a 12 pack-year smoking history. He has never used smokeless tobacco. He reports that he does not drink alcohol or use illicit drugs.   No Known Allergies  Medications:  Current Outpatient Prescriptions on File Prior to Visit  Medication Sig Dispense Refill  . baclofen (LIORESAL) 20 MG tablet One  tablet twice a day and 2 tablets at night  120 each  11  . diazepam (VALIUM) 10 MG tablet Take 1 tablet (10 mg total) by mouth 3 (three) times daily.  90 tablet  5  . docusate sodium (COLACE) 100 MG capsule Take 100 mg by mouth 2 (two) times daily.       [name] lisinopril (PRINIVIL) 10 MG tablet Take 1 tablet (10 mg total) by mouth daily.  30 tablet  3   No current facility-administered medications on file prior to visit.    ROS:  Out of a complete 14 system review of symptoms, the patient complains only of the following symptoms, and all other reviewed systems are negative.  Activity change  Rectal bleeding Rectal pain, incontinence of bowels Difficulty urinating, pain with urination, incontinence of bladder Joint pain, back pain, achy muscles, muscle cramps, walking difficulties, neck stiffness Skin rash, itching Numbness, weakness, tremors Depression, anxiety  Blood pressure 137/93, pulse 93, weight 200 lb (90.719 kg).  Physical Exam  General: The patient is alert and cooperative at the time of the examination. The patient has a flat affect.  Skin: No significant peripheral edema is noted.   Neurologic Exam  Mental status: The patient is oriented x 3.  Cranial nerves: Facial symmetry is present. Speech is normal, no aphasia or dysarthria is noted. Extraocular movements are full. Visual fields are full.  Motor: The patient has good strength in  the upper extremities. with the lower extremities, there is 4/5 strength on the left leg, 4 minus/5 strength with the right leg, increased tone is noted on the right leg greater than the left.   Sensory examination: Soft touch sensation is symmetric on the face and arms, the sensation is symmetric in the legs but the sensation is dysesthetic.   Coordination: The patient has good finger-nose-finger bilaterally.the patient is able to form heel shin with the left leg, unable to perform on the right   Gait and station: The patient has a  circumduction gait mainly with the right leg, uses a walker for ambulation. Ankle clonus is present with weightbearing on the right leg.   Reflexes: Deep tendon reflexes are symmetric in arms, increased reflexes in the legs, right greater than left. Ankle clonus is seen on the right that is sustained. The patient wears an AFO brace on the right.    Assessment/Plan:   One. T4 level spinal cord injury, paraparesis  2. Gait disorder  3. Neurogenic bowel and bladder  4. Depression  The patient has significant spasticity and weakness involving primarily the right leg. The patient is having ongoing pain associated with the spinal cord injury. He is having some problems with depression. He will be getting his medical insurance sometime within the next several weeks or a month or so. Once he has insurance, the patient should be eligible to be seen through a pain center, and hopefully get a spinal stimulator to help the pain issues. The patient would be a good candidate for a baclofen pump placement to help control spasticity. He is on baclofen and Valium without complete control of the right leg spasticity. The patient will followup in 4-6 months. He will contact me when his insurance benefits have started, and if he is willing to undergo a baclofen pump placement.   Marlan Palau. Keith [name] MD 10/10/2013 7:36 PM  [facility] [address] [place], [place] [id number]  Phone [phone] Fax [phone]

## 2013-10-25 ENCOUNTER — Telehealth: Payer: Self-pay | Admitting: Neurology

## 2013-10-25 NOTE — Telephone Encounter (Signed)
Patient stated Baclofen Pump is not an option due to not qualifying for medicaid.  Please call to discuss other options available.

## 2013-10-25 NOTE — Telephone Encounter (Signed)
Events noted

## 2013-10-25 NOTE — Telephone Encounter (Signed)
FYI, Patient was denied Medicaid therefore, Baclofen pump is not an option at this time.

## 2013-11-06 ENCOUNTER — Telehealth: Payer: Self-pay | Admitting: Neurology

## 2013-11-06 DIAGNOSIS — F32A Depression, unspecified: Secondary | ICD-10-CM

## 2013-11-06 DIAGNOSIS — F329 Major depressive disorder, single episode, unspecified: Secondary | ICD-10-CM

## 2013-11-06 MED ORDER — DIAZEPAM 10 MG PO TABS: 10.0000 mg | ORAL_TABLET | Freq: Three times a day (TID) | ORAL | Status: DC

## 2013-11-06 MED ORDER — MORPHINE SULFATE ER 30 MG PO TBCR
30.0000 mg | EXTENDED_RELEASE_TABLET | Freq: Two times a day (BID) | ORAL | Status: DC
Start: 2013-11-06 — End: 2013-12-04

## 2013-11-06 MED ORDER — OXYCODONE HCL 10 MG PO TABS: 10.0000 mg | ORAL_TABLET | Freq: Four times a day (QID) | ORAL | Status: DC | PRN

## 2013-11-06 MED ORDER — TIZANIDINE HCL 2 MG PO TABS: 2.0000 mg | ORAL_TABLET | Freq: Three times a day (TID) | ORAL | Status: DC

## 2013-11-06 NOTE — Telephone Encounter (Signed)
Patient needs written Rx for Oxycodone 10 mg #120 and Morphine Sulfur--please call patient when ready for pick up--patient also needs Rx for Diazipam 10 mg #90 called to [facility]--patient also wants to discuss an alternative for a Baclofen pump--patient is very upset--please call.

## 2013-11-06 NOTE — Telephone Encounter (Signed)
I called patient. The patient is having increasing spasticity over time. Is getting increasingly more depressed. I will refer him to psychiatry, as tizanidine to his regimen. The patient indicates that he has Medicare, and he cannot afford the 20% he would owe for getting the baclofen pump. The patient is losing function, he is not stretching out on a regular basis.

## 2013-11-06 NOTE — Telephone Encounter (Signed)
Patient would like to discuss an alternative option in place of the Baclofen Pump.  Please advise.  Thank you. As well, he is requesting a refill on Morphine, Oxycodone and Diazepam.

## 2013-11-07 NOTE — Telephone Encounter (Signed)
Called pt to inform him that his Rx was ready to be picked up at the front desk and if he has any other problems, questions or concerns to call the office. Pt verbalized understanding. °

## 2013-11-09 ENCOUNTER — Telehealth: Payer: Self-pay | Admitting: *Deleted

## 2013-11-09 NOTE — Telephone Encounter (Signed)
Dr. Anne HahnWillis referred patient to Dr. Caprice RenshawPlovsky's office for council, called patient to give him Dr. Caprice RenshawPlovsky's number to schedule his appointment, no answer left message with his office number to set his appointment up, and to call [facility] back if he has any questions or concerns.

## 2013-11-28 ENCOUNTER — Telehealth: Payer: Self-pay | Admitting: Neurology

## 2013-11-28 NOTE — Telephone Encounter (Signed)
Spoke with patient and he said that he tilted his wheelchair and fell backwards down 12 flight of stairs. Called EMS and they said no abrasions, no broken bones that they could see without xrays, did not got to hospital.  He is in a lot of pain, can hardly talk, is requesting to speak with Dr Anne HahnWillis personally as to his suggestions.

## 2013-11-28 NOTE — Telephone Encounter (Signed)
Patient called stating he had a bad fall this morning and [facility] and was told to contact his neurologist.

## 2013-11-28 NOTE — Telephone Encounter (Signed)
I called patient. [place]. The patient had a fall this morning, did not get x-rays. He is having some increasing in pain. The patient will need x-ray at some point. The patient indicates that he has been more active recently, and his spasticity in the legs has improved. He is able to walk for about 45 minutes at a time. He is doing much better in this regard. His mental outlook is much better.

## 2013-12-04 ENCOUNTER — Other Ambulatory Visit: Payer: Self-pay | Admitting: Neurology

## 2013-12-04 MED ORDER — OXYCODONE HCL 10 MG PO TABS: 10.0000 mg | ORAL_TABLET | Freq: Four times a day (QID) | ORAL | Status: DC | PRN

## 2013-12-04 MED ORDER — MORPHINE SULFATE ER 30 MG PO TBCR: 30.0000 mg | EXTENDED_RELEASE_TABLET | Freq: Two times a day (BID) | ORAL | Status: DC

## 2013-12-04 NOTE — Telephone Encounter (Signed)
Patient calling to get written Rx's for Morphine and Oxycodone--please call patient when ready for pickup--would like ASAP--thank you.

## 2013-12-04 NOTE — Telephone Encounter (Signed)
Request forwarded to provider for approval  

## 2013-12-04 NOTE — Telephone Encounter (Signed)
Called pt to inform him that his Rx was ready to be picked up at the front desk and if he has any other problems, questions or concerns to call the office. Pt verbalized understanding. °

## 2014-01-01 ENCOUNTER — Telehealth: Payer: Self-pay | Admitting: Neurology

## 2014-01-01 MED ORDER — MORPHINE SULFATE ER 30 MG PO TBCR: 30.0000 mg | EXTENDED_RELEASE_TABLET | Freq: Two times a day (BID) | ORAL | Status: DC

## 2014-01-01 MED ORDER — OXYCODONE HCL 10 MG PO TABS: 10.0000 mg | ORAL_TABLET | Freq: Four times a day (QID) | ORAL | Status: DC | PRN

## 2014-01-01 NOTE — Telephone Encounter (Signed)
Patient very concerned about not having medication.  Has never been without medication and wants to know what to expect?  Please return call asap.  Thanks

## 2014-01-01 NOTE — Telephone Encounter (Signed)
Patient requesting a rush on Rx refill due to Parent's pick up medication.  Thanks

## 2014-01-01 NOTE — Telephone Encounter (Signed)
Per previous note, request was already sent to MD for approval.  We will contact patient when Rx has been signed and is ready for pick up.

## 2014-01-01 NOTE — Telephone Encounter (Signed)
Needs RX for Oxycodone HCl 10 MG TABS and morphine (MS CONTIN) 30 MG 12 hr tablet please call when ready

## 2014-01-02 NOTE — Telephone Encounter (Signed)
Pt's Rx was picked up today.

## 2014-01-29 ENCOUNTER — Other Ambulatory Visit: Payer: Self-pay | Admitting: Neurology

## 2014-01-29 MED ORDER — MORPHINE SULFATE ER 30 MG PO TBCR: 30.0000 mg | EXTENDED_RELEASE_TABLET | Freq: Two times a day (BID) | ORAL | Status: DC

## 2014-01-29 MED ORDER — OXYCODONE HCL 10 MG PO TABS: 10.0000 mg | ORAL_TABLET | Freq: Four times a day (QID) | ORAL | Status: DC | PRN

## 2014-01-29 NOTE — Telephone Encounter (Signed)
Patient requesting written refill script of 10 mg oxycodone and 30 mg morphine. Please call when ready for pick up.

## 2014-01-30 NOTE — Telephone Encounter (Signed)
Called pt to inform him that his Rx was ready to be picked up at the front desk and if he has any other problems, questions or concerns to call the office. Pt verbalized understanding. °

## 2014-02-21 ENCOUNTER — Telehealth: Payer: Self-pay | Admitting: Neurology

## 2014-02-21 NOTE — Telephone Encounter (Signed)
I called patient. The patient indicates that he is now walking, but he is having more pain. The patient is getting his pain medications. [place] now [place]. I have indicated that now that he has Medicare a and B., we may be a will to get into a pain Center to consider a spinal stimulator for pain control. The patient does not appear to be amenable to this option at this point.

## 2014-02-21 NOTE — Telephone Encounter (Signed)
Tried to call patient for more information but voice message is not set up.  I will forward to doctor.

## 2014-02-21 NOTE — Telephone Encounter (Signed)
Patient experiencing difficulties, having massive spasms and pain.  Please call anytime.

## 2014-02-23 ENCOUNTER — Other Ambulatory Visit: Payer: Self-pay | Admitting: Neurology

## 2014-02-23 MED ORDER — OXYCODONE HCL 10 MG PO TABS: 10.0000 mg | ORAL_TABLET | Freq: Four times a day (QID) | ORAL | Status: DC | PRN

## 2014-02-23 MED ORDER — MORPHINE SULFATE ER 30 MG PO TBCR: 30.0000 mg | EXTENDED_RELEASE_TABLET | Freq: Two times a day (BID) | ORAL | Status: DC

## 2014-02-23 NOTE — Telephone Encounter (Signed)
Patient requesting refill of Oxycodone HCl 10 MG TABS and morphine (MS CONTIN) 30 MG 12 hr tablet please call when ready for pick up.

## 2014-02-26 NOTE — Telephone Encounter (Signed)
Called pt to inform him that his Rx was ready to be picked up at the front desk and if he has any other problems, questions or concerns to call the office. Pt verbalized understanding. °

## 2014-03-26 ENCOUNTER — Other Ambulatory Visit: Payer: Self-pay | Admitting: Neurology

## 2014-03-26 MED ORDER — MORPHINE SULFATE ER 30 MG PO TBCR: 30.0000 mg | EXTENDED_RELEASE_TABLET | Freq: Two times a day (BID) | ORAL | Status: DC

## 2014-03-26 MED ORDER — OXYCODONE HCL 10 MG PO TABS: 10.0000 mg | ORAL_TABLET | Freq: Four times a day (QID) | ORAL | Status: DC | PRN

## 2014-03-26 NOTE — Telephone Encounter (Signed)
Request entered, forwarded to provider for approval.  

## 2014-03-26 NOTE — Telephone Encounter (Addendum)
I called the patient to let them know their Rx for Oxycodone and Morphine was ready for pickup. Patient was instructed to bring Photo ID. Patient states that his father, Jason Dominguez will be the one to pick up his Rx. Patient states that he lives four hours away and that he is a quadriplegic so his parents have to pick up his Rx.

## 2014-03-26 NOTE — Telephone Encounter (Signed)
Patient requesting refill of oxycodone and morphine, please call when ready for pick up.

## 2014-04-17 ENCOUNTER — Ambulatory Visit (INDEPENDENT_AMBULATORY_CARE_PROVIDER_SITE_OTHER): Payer: Medicare Other | Admitting: Neurology

## 2014-04-17 ENCOUNTER — Encounter: Payer: Self-pay | Admitting: Neurology

## 2014-04-17 VITALS — BP 140/89 | HR 85 | Ht 71.0 in

## 2014-04-17 DIAGNOSIS — F32A Depression, unspecified: Secondary | ICD-10-CM

## 2014-04-17 DIAGNOSIS — F4542 Pain disorder with related psychological factors: Secondary | ICD-10-CM

## 2014-04-17 DIAGNOSIS — F329 Major depressive disorder, single episode, unspecified: Secondary | ICD-10-CM

## 2014-04-17 DIAGNOSIS — R197 Diarrhea, unspecified: Secondary | ICD-10-CM

## 2014-04-17 DIAGNOSIS — M4714 Other spondylosis with myelopathy, thoracic region: Secondary | ICD-10-CM

## 2014-04-17 MED ORDER — OXYCODONE HCL 15 MG PO TABS: 15.0000 mg | ORAL_TABLET | Freq: Four times a day (QID) | ORAL | Status: DC | PRN

## 2014-04-17 MED ORDER — NORTRIPTYLINE HCL 25 MG PO CAPS
75.0000 mg | ORAL_CAPSULE | Freq: Every day | ORAL | Status: DC
Start: 2014-04-17 — End: 2014-08-29

## 2014-04-17 NOTE — Patient Instructions (Signed)

## 2014-04-17 NOTE — Progress Notes (Signed)
Reason for visit: Chronic pain  Jason Dominguez is an 33 y.o. male  History of present illness:  Jason Dominguez is a 33 year old right-handed white male with a history of a spinal cord injury at the T4 level. The patient has chronic pain in the back, and around the chest area associated with hypersensitivity of the skin. The patient has a paraparesis with weakness primarily involving the right leg. The patient has significant spasticity of the lower extremities, right greater than left. He has gotten out of the wheelchair, and he is using a walker for ambulation. Indicates that this has resulted in increased pain for him. The patient is on MS Contin taking 30 mg twice daily, and he takes 10 mg oxycodone tablets up to 4 times daily for breakthrough pain. The patient indicates that he can no longer tolerate the pain, he is getting depressed because of this. He has not been able to do much in the way of physical activity other than sitting in the house. The patient comes to this office for further evaluation. The patient reports issues with chronic diarrhea. He is not sleeping well at night.  Past Medical History  Diagnosis Date  . Bipolar 1 disorder, depressed   . Drug abuse   . Alcohol abuse   . 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 Chronic back pain   . Hypertension   . Anxiety   . Depression   . History of kidney stones   . Gait disorder   . Thoracic spondylosis with myelopathy 10/03/2012    Past Surgical History  Procedure Laterality Date  . Back surgery      T4 spinal fusion with Harrington rods  . Hernia repair      stomach per pt  . Mandible fracture surgery      Cosmetic     History reviewed. No pertinent family history.  Social history:  reports that he has been smoking Cigarettes.  He has a 12 pack-year smoking history. He has never used smokeless tobacco. He reports that he does not drink alcohol or use illicit drugs.   No Known  Allergies  Medications:  Current Outpatient Prescriptions on File Prior to Visit  Medication Sig Dispense Refill  . baclofen (LIORESAL) 20 MG tablet One tablet twice a day and 2 tablets at night 120 each 11  . diazepam (VALIUM) 10 MG tablet Take 1 tablet (10 mg total) by mouth 3 (three) times daily. 90 tablet 5  . morphine (MS CONTIN) 30 MG 12 hr tablet Take 1 tablet (30 mg total) by mouth 2 (two) times daily. 60 tablet 0   No current facility-administered medications on file prior to visit.    ROS:  Out of a complete 14 system review of symptoms, the patient complains only of the following symptoms, and all other reviewed systems are negative.  Rectal bleeding, incontinence of bowels Restless legs, insomnia, frequent waking, daytime sleepiness Difficulty urinating, painful urination, incontinence of the bladder Back pain, achy muscles, muscle cramps, walking difficulties, neck pain, neck stiffness Numbness, weakness, tremors Agitation, depression, anxiety, suicidal thoughts  Blood pressure 140/89, pulse 85, height 5\' 11"  (1.803 m), weight 0 lb (0 kg).  Physical Exam  General: The patient is alert and cooperative at the time of the examination.  Skin: No significant peripheral edema is noted. The patient has an AFO brace on the right.   Neurologic Exam  Mental status: The patient is oriented x  3. The patient is somewhat tearful.  Cranial nerves: Facial symmetry is present. Speech is normal, no aphasia or dysarthria is noted. Extraocular movements are full. Visual fields are full.  Motor: The patient has good strength in the upper extremities. With the lower extremity, the patient has 4/5 strength on the left leg. The patient has 3/5 strength proximally in the right leg, has good extensor strength at the right knee. The patient has weakness with abduction and abduction of the right leg.  Sensory examination: Soft touch sensation is symmetric on the face, arms, and  legs.  Coordination: The patient has good finger-nose-finger bilaterally. The patient was able to perform heel-to-shin with the left leg, not the right  Gait and station: The patient has a wide-based, circumduction gait primarily with the right leg. The patient has ankle clonus with walking on the right. Gait is wide-based. The patient uses a walker for ambulation.  Reflexes: Deep tendon reflexes are symmetric on the arms, the patient has brisk knee jerk reflexes, right greater than left.   Assessment/Plan:  1. Spastic paraparesis, T4 level spinal cord injury  2. Chronic pain syndrome  3. Gait disorder  4. Chronic diarrhea  5. Depression  The patient has significant issues that are impairing his quality of life, and his activity levels. The patient is having severe pain. I will refer the patient for a pain center evaluation for possible spinal cord stimulator. The oxycodone prescription will be increased to the 15 mg tablets, 120 given, no refills. This must last 28 days. The patient will be referred to a gastroenterologist for the chronic diarrhea issues. The nortriptyline will be increased taking 75 mg daily. He will follow-up in about 4 months.  Jason Palau. Keith [name] MD 04/17/2014 4:44 PM  [facility] [address] [place], [place] [id number]  Phone [phone] Fax [phone]

## 2014-04-23 ENCOUNTER — Other Ambulatory Visit: Payer: Self-pay | Admitting: Neurology

## 2014-04-23 MED ORDER — MORPHINE SULFATE ER 30 MG PO TBCR: 30.0000 mg | EXTENDED_RELEASE_TABLET | Freq: Two times a day (BID) | ORAL | Status: DC

## 2014-04-23 MED ORDER — DIAZEPAM 10 MG PO TABS: 10.0000 mg | ORAL_TABLET | Freq: Three times a day (TID) | ORAL | Status: DC

## 2014-04-23 NOTE — Telephone Encounter (Signed)
Patient requesting Rx called [facility] for diazepam (VALIUM) 10 MG tablet and also a written Rx for morphine (MS CONTIN) 30 MG 12 hr tablet.  Please call when ready for pick up.

## 2014-04-23 NOTE — Telephone Encounter (Signed)
Request entered, forwarded to provider for approval.  

## 2014-04-24 NOTE — Telephone Encounter (Signed)
I called the patient to let them know their Rx for MS Contin and Valium were ready for pickup. Patient was instructed to bring Photo ID.

## 2014-05-02 ENCOUNTER — Telehealth: Payer: Self-pay | Admitting: Neurology

## 2014-05-02 NOTE — Telephone Encounter (Signed)
Patient stated he's completely out Baclofen, informed patient that [facility] would be [facility] [place] after signature of WID due to Dr. Anne HahnWillis out of the office.  Please call and advise

## 2014-05-14 ENCOUNTER — Telehealth: Payer: Self-pay | Admitting: Neurology

## 2014-05-14 MED ORDER — OXYCODONE HCL 15 MG PO TABS: 15.0000 mg | ORAL_TABLET | Freq: Four times a day (QID) | ORAL | Status: DC | PRN

## 2014-05-14 NOTE — Telephone Encounter (Signed)
I called patient. The oxycodone and MS Contin prescription has gotten out of phase. I'll write for one week of the oxycodone, neck week he can pick up a full month supply of both prescriptions, this will include 120 oxycodone along with the MS Contin.

## 2014-05-14 NOTE — Telephone Encounter (Signed)
Patient is calling to get written Rx's  for Oxycodone and MS Contin. Please call patient when ready for pick up.

## 2014-05-14 NOTE — Telephone Encounter (Signed)
I called the patient to let them know their Rx for Oxycodone was ready for pickup. Patient was instructed to bring Photo ID. 

## 2014-05-14 NOTE — Telephone Encounter (Signed)
Patient is requesting Morphine and Oxycodone refills.  Oxy was last prescribed on 12/01, and Morphine on 12/07, must last 28 days.  I called the patient back.  Says he is not out of Morphine, but was trying to get his meds back on the same schedule.  When he was in for OV on 12/01 his Oxy dose was changed, and a new Rx was written.  He is aware early refills are not permissible on C-II drugs.  Patient would like to know if both Rx's can be written today, and filled when due, or if provider recommends something else to get meds back in sync.  Due to his medical condition, it is very difficult to make multiple trips to the office to pick up Rx's.  Please advise.  Thank you.

## 2014-05-21 ENCOUNTER — Other Ambulatory Visit: Payer: Self-pay | Admitting: Neurology

## 2014-05-21 MED ORDER — OXYCODONE HCL 15 MG PO TABS: 15.0000 mg | ORAL_TABLET | Freq: Four times a day (QID) | ORAL | Status: DC | PRN

## 2014-05-21 MED ORDER — MORPHINE SULFATE ER 30 MG PO TBCR: 30.0000 mg | EXTENDED_RELEASE_TABLET | Freq: Two times a day (BID) | ORAL | Status: DC

## 2014-05-21 NOTE — Telephone Encounter (Signed)
Request entered, forwarded to provider for review.    Previous encounter says:  [name], MD at 05/14/2014 5:28 PM     Status: Signed       Expand All Collapse All   I called patient. The oxycodone and MS Contin prescription has gotten out of phase. I'll write for one week of the oxycodone, neck week he can pick up a full month supply of both prescriptions, this will include 120 oxycodone along with the MS Contin

## 2014-05-21 NOTE — Telephone Encounter (Signed)
I called the patient to let them know their Rx for Morphine and Oxycodone was ready for pickup. Patient was instructed to bring Photo ID.

## 2014-05-21 NOTE — Telephone Encounter (Signed)
Patient calling for Rx refill request for oxyCODONE (ROXICODONE) 15 MG immediate release tablet and morphine (MS CONTIN) 30 MG 12 hr tablet.  Please call when ready for pick up.

## 2014-06-15 ENCOUNTER — Other Ambulatory Visit: Payer: Self-pay | Admitting: *Deleted

## 2014-06-15 MED ORDER — MORPHINE SULFATE ER 30 MG PO TBCR: 30.0000 mg | EXTENDED_RELEASE_TABLET | Freq: Two times a day (BID) | ORAL | Status: DC

## 2014-06-15 MED ORDER — OXYCODONE HCL 15 MG PO TABS: 15.0000 mg | ORAL_TABLET | Freq: Four times a day (QID) | ORAL | Status: DC | PRN

## 2014-06-15 NOTE — Telephone Encounter (Signed)
Request entered, forwarded to provider for approval.   Sunday will be the 28th day.

## 2014-06-15 NOTE — Telephone Encounter (Signed)
Jess I know these need to be printed out so do I send it to you or the physician?

## 2014-06-18 ENCOUNTER — Telehealth: Payer: Self-pay

## 2014-06-18 NOTE — Telephone Encounter (Signed)
Called patient and informed Rx ready for pick up at front desk. Patient verbalized understanding.  

## 2014-07-16 ENCOUNTER — Other Ambulatory Visit: Payer: Self-pay | Admitting: *Deleted

## 2014-07-16 ENCOUNTER — Telehealth: Payer: Self-pay

## 2014-07-16 MED ORDER — MORPHINE SULFATE ER 30 MG PO TBCR: 30.0000 mg | EXTENDED_RELEASE_TABLET | Freq: Two times a day (BID) | ORAL | Status: DC

## 2014-07-16 MED ORDER — OXYCODONE HCL 15 MG PO TABS: 15.0000 mg | ORAL_TABLET | Freq: Four times a day (QID) | ORAL | Status: DC | PRN

## 2014-07-16 NOTE — Telephone Encounter (Signed)
Called patient and informed Rx ready for pick up at front desk. Patient verbalized understanding.  

## 2014-07-16 NOTE — Telephone Encounter (Signed)
Patient is needing oxycodone and morphine, they were last filled 06/15/14

## 2014-08-07 ENCOUNTER — Telehealth: Payer: Self-pay | Admitting: Neurology

## 2014-08-07 NOTE — Telephone Encounter (Signed)
Patient is calling for written Rx for Morphine 30 mg 12 hr tablets 2 times per day and Rx Oxydodone 15 mg 4 times per day.  Please call.

## 2014-08-07 NOTE — Telephone Encounter (Signed)
I called the patient. The prescription is not due until March 28. I will put a reminder into write the prescription the day before.

## 2014-08-13 ENCOUNTER — Other Ambulatory Visit: Payer: Self-pay | Admitting: Neurology

## 2014-08-13 MED ORDER — MORPHINE SULFATE ER 30 MG PO TBCR: 30.0000 mg | EXTENDED_RELEASE_TABLET | Freq: Two times a day (BID) | ORAL | Status: DC

## 2014-08-13 MED ORDER — OXYCODONE HCL 15 MG PO TABS: 15.0000 mg | ORAL_TABLET | Freq: Four times a day (QID) | ORAL | Status: DC | PRN

## 2014-08-29 ENCOUNTER — Telehealth: Payer: Self-pay | Admitting: Neurology

## 2014-08-29 ENCOUNTER — Ambulatory Visit (INDEPENDENT_AMBULATORY_CARE_PROVIDER_SITE_OTHER): Payer: Medicare Other | Admitting: Neurology

## 2014-08-29 ENCOUNTER — Encounter: Payer: Self-pay | Admitting: Neurology

## 2014-08-29 VITALS — BP 129/85 | HR 82 | Ht 71.0 in | Wt 159.8 lb

## 2014-08-29 DIAGNOSIS — F319 Bipolar disorder, unspecified: Secondary | ICD-10-CM

## 2014-08-29 DIAGNOSIS — F32A Depression, unspecified: Secondary | ICD-10-CM

## 2014-08-29 DIAGNOSIS — F329 Major depressive disorder, single episode, unspecified: Secondary | ICD-10-CM

## 2014-08-29 DIAGNOSIS — R269 Unspecified abnormalities of gait and mobility: Secondary | ICD-10-CM | POA: Diagnosis not present

## 2014-08-29 DIAGNOSIS — M4714 Other spondylosis with myelopathy, thoracic region: Secondary | ICD-10-CM

## 2014-08-29 MED ORDER — BUPROPION HCL ER (XL) 150 MG PO TB24: ORAL_TABLET | ORAL | Status: DC

## 2014-08-29 NOTE — Telephone Encounter (Signed)
Patient concerned about paperwork received at check out that states he's depressed and BiPolar.  Please call and advise.

## 2014-08-29 NOTE — Progress Notes (Signed)
Reason for visit: Spinal cord injury  Jason Dominguez is an 34 y.o. male  History of present illness:  Jason Dominguez is a 34 year old right-handed white male with a history of a thoracic spinal cord injury. The patient has spasticity of the lower extremities, right much worse than the left. He is able to ambulate some with a walker. He indicates that even prior to the spinal cord injury, he had significant problems with depression requiring medical therapy. The depression has worsened since a spinal cord injury. The patient is now living in a trailer, and he splits his time between [address] and the [place] area. He is becoming withdrawn, has lost interest in many activities. He is not sleeping well. The spasticity in the right leg is quite severe even on baclofen and Valium. He continues on his chronic opiate medications for pain. He comes to this office for an evaluation. He indicates that he is falling frequently, up to 4 times a week. He has not sustained significant injury.  Past Medical History  Diagnosis Date  . Bipolar 1 disorder, depressed   . Drug abuse   . Alcohol abuse   . 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 Chronic back pain   . Hypertension   . Anxiety   . Depression   . History of kidney stones   . Gait disorder   . Thoracic spondylosis with myelopathy 10/03/2012    Past Surgical History  Procedure Laterality Date  . Back surgery      T4 spinal fusion with Harrington rods  . Hernia repair      stomach per pt  . Mandible fracture surgery      Cosmetic     History reviewed. No pertinent family history.  Social history:  reports that he has been smoking Cigarettes.  He has a 36 pack-year smoking history. He has never used smokeless tobacco. He reports that he does not drink alcohol or use illicit drugs.   No Known Allergies  Medications:  Prior to Admission medications   Medication Sig Start Date End Date Taking? Authorizing  Provider  baclofen (LIORESAL) 20 MG tablet One tablet twice daily and two at bedtime 05/02/14  Yes [name], MD  diazepam (VALIUM) 10 MG tablet Take 1 tablet (10 mg total) by mouth 3 (three) times daily. 04/23/14  Yes [name], MD  morphine (MS CONTIN) 30 MG 12 hr tablet Take 1 tablet (30 mg total) by mouth 2 (two) times daily. 08/13/14  Yes [name], MD  oxyCODONE (ROXICODONE) 15 MG immediate release tablet Take 1 tablet (15 mg total) by mouth every 6 (six) hours as needed for pain (Must last 28 days). 08/13/14  Yes [name], MD  buPROPion (WELLBUTRIN XL) 150 MG 24 hr tablet One tablet daily for 4 weeks, then take 2 tablets daily 08/29/14   [name], MD    ROS:  Out of a complete 14 system review of symptoms, the patient complains only of the following symptoms, and all other reviewed systems are negative.  Activity change, appetite change, fatigue, excessive sweating Rectal bleeding, black stools, constipation, diarrhea, rectal pain Insomnia Difficulty urinating, painful urination, incontinence of the bladder, decreased urine Joint pain, joint swelling, back pain, achy muscles, muscle cramps, walking difficulty, neck pain, neck stiffness Numbness, weakness, tremors Depression, anxiety  Blood pressure 129/85, pulse 82, height  (1.803 m), weight 159 lb 12.8 oz (72.485  kg).  Physical Exam  General: The patient is alert and cooperative at the time of the examination. The patient has a flat affect.  Skin: No significant peripheral edema is noted.   Neurologic Exam  Mental status: The patient is alert and oriented x 3 at the time of the examination. The patient has apparent normal recent and remote memory, with an apparently normal attention span and concentration ability.   Cranial nerves: Facial symmetry is present. Speech is normal, no aphasia or dysarthria is noted. Extraocular movements are full. Visual fields are full.  Motor: The  patient has good strength in the upper extremities. With the lower extremity, there is significant increase in motor tone the right leg. Left leg is of relatively normal tone. There is good strength in the left leg, severe spasticity in the right leg.  Sensory examination: Soft touch sensation is symmetric on the face, arms, and legs, but sensation in general is decreased on both legs.  Coordination: The patient has good finger-nose-finger bilaterally. The patient is able to perform heel-to-shin on the left, not on the right.  Gait and station: The patient has a circumduction type gait with a right leg. Clonus is seen throughout the leg with weightbearing. The patient walks with a walker. Gait is slow and deliberate, slightly wide-based.  Reflexes: Deep tendon reflexes are symmetric.   Assessment/Plan:  1. Thoracic myelopathy  2. Gait disorder  3. Depression  The patient is being crippled by underlying depression, with a tendency for withdrawal, lack of interest. The patient is not stretching on regular basis. The patient continues to fall regularly. The patient will be placed on Wellbutrin. I discussed the possibility of a baclofen pump, but the patient is not interested in this treatment modality. He is mainly concerned about the cost of the pump. He has Medicare. He will continue on with the medical therapy for pain. He will follow-up in 6 months. He signed a narcotic medication treatment agreement.  Marlan Palau. Keith Ralynn San MD 08/29/2014 9:13 PM  [facility] [address] [place], [place] [id number]  Phone [phone] Fax [phone]

## 2014-08-29 NOTE — Patient Instructions (Signed)

## 2014-08-30 ENCOUNTER — Encounter: Payer: Self-pay | Admitting: Neurology

## 2014-08-30 NOTE — Telephone Encounter (Signed)
I returned the patient's call. He does not have a voicemail set up. I will try again.

## 2014-08-30 NOTE — Telephone Encounter (Signed)
I called patient. The patient indicates that he has been told that he does not have bipolar disorder, apparently was diagnosed with this previously. I have taken this out of his history in epic.

## 2014-08-30 NOTE — Telephone Encounter (Signed)
I spoke to the patient. He states he is depressed but he is confused by the bipolar diagnosis, since it was not discussed during his visit. He is concerned he could lose his gun license from this diagnosis.

## 2014-09-07 ENCOUNTER — Telehealth: Payer: Self-pay | Admitting: Neurology

## 2014-09-07 NOTE — Telephone Encounter (Signed)
Rx's were last written on 03/28.  It is a few days too soon.  I called the patient back x3.  Got no answer any of the times I called.  Voicemail has not been set up, unable to leave message.

## 2014-09-07 NOTE — Telephone Encounter (Signed)
Patient called needing refill on Rx. oxyCODONE (ROXICODONE) 15 MG immediate release tablet and Rx. morphine (MS CONTIN) 30 MG 12 hr tablet. Please call and advise.

## 2014-09-10 ENCOUNTER — Other Ambulatory Visit: Payer: Self-pay

## 2014-09-10 MED ORDER — MORPHINE SULFATE ER 30 MG PO TBCR: 30.0000 mg | EXTENDED_RELEASE_TABLET | Freq: Two times a day (BID) | ORAL | Status: DC

## 2014-09-10 MED ORDER — OXYCODONE HCL 15 MG PO TABS: 15.0000 mg | ORAL_TABLET | Freq: Four times a day (QID) | ORAL | Status: DC | PRN

## 2014-10-03 ENCOUNTER — Telehealth: Payer: Self-pay | Admitting: Neurology

## 2014-10-03 NOTE — Telephone Encounter (Signed)
Pt called regarding his quality of life and disablilty. Would like to speak with Dr Anne HahnWillis. Please call and advise. Pt can be reached at [phone]. [place]

## 2014-10-03 NOTE — Telephone Encounter (Signed)
I called the patient. He states he is in terrible pain. He can't do anything because his back hurts too bad. I mentioned the possibility of a baclofen pump, as Dr. Anne HahnWillis' last office note states. He said that would not help the type of pain he has. He wants to talk to Dr. Anne HahnWillis about other options for pain management. He sounded really upset and stated he cannot go on like this. I questioned if he was thinking of harming himself and he stated he could not do that because he has a little girl to take care of.

## 2014-10-03 NOTE — Telephone Encounter (Signed)
I called the patient. The patient is crying, indicating that the pain is severe. He indicates that most pain centers will not see him secondary to his prior history of drug abuse. I have recommended switching to a fentanyl patch, using oxycodone for breakthrough pain, he does not wish to consider this indicating that the fentanyl has not worked previously. I have recommended sending him to a neurosurgeon to get a spinal stimulator placed, he does not wish to consider this either. I have indicated to him that if he is in severe pain, he should consider any option that may help him. The patient then hung up the phone.

## 2014-10-08 ENCOUNTER — Telehealth: Payer: Self-pay | Admitting: Neurology

## 2014-10-08 NOTE — Telephone Encounter (Signed)
Oxycodone and Morphine were last written on 04/25, today is the 29th day since last Rx.  Patient's last OV was in April, has routine follow up scheduled in Oct.  Dr Anne HahnWillis is out of the office.  [facility].

## 2014-10-08 NOTE — Telephone Encounter (Signed)
Patient is calling to order 2 written Rx:  Oxycodone 15 mg and morphine 30 mg 12 hr tablets. He also needs a refill for Rx Valium 15 mg called in to [facility].  Thanks!

## 2014-10-09 ENCOUNTER — Other Ambulatory Visit: Payer: Self-pay

## 2014-10-09 ENCOUNTER — Telehealth: Payer: Self-pay | Admitting: Neurology

## 2014-10-09 ENCOUNTER — Telehealth: Payer: Self-pay

## 2014-10-09 MED ORDER — MORPHINE SULFATE ER 30 MG PO TBCR: 30.0000 mg | EXTENDED_RELEASE_TABLET | Freq: Two times a day (BID) | ORAL | Status: DC

## 2014-10-09 MED ORDER — DIAZEPAM 10 MG PO TABS: 10.0000 mg | ORAL_TABLET | Freq: Three times a day (TID) | ORAL | Status: DC

## 2014-10-09 MED ORDER — OXYCODONE HCL 15 MG PO TABS: 15.0000 mg | ORAL_TABLET | Freq: Four times a day (QID) | ORAL | Status: DC | PRN

## 2014-10-09 NOTE — Telephone Encounter (Signed)
WID did not approve.  Request was re-routed to Dr Anne HahnWillis today.  I called the patient back.  He is aware.

## 2014-10-09 NOTE — Telephone Encounter (Signed)
Pt called wanting to follow up on his script for oxycodone and morphine.  Pt wanted to know if they were ready for pick up. I did not see any documentation that they were ready. Pt can be reached at [phone].

## 2014-10-09 NOTE — Telephone Encounter (Signed)
Rx ready for pick up. 

## 2014-10-09 NOTE — Telephone Encounter (Signed)
I spoke with patient who wanted to let Dr Anne HahnWillis know he checked himself into a "psych ward" last week.  They released him and recommended he get counseling, and also that he follow up with our office.  Patient would not divulge the reason for going.  Says he does not have a PCP or any other provider to consult at this time, concerned we may not want to see him.  Says he has lost 40 lbs over the past couple of months, unsure why.  Patient questioning if/when he should follow up with us.  Also requesting refills on Morphine, Oxycodone and Diazepam.  Thank you.

## 2014-10-09 NOTE — Telephone Encounter (Signed)
Patient is calling for a refill for Diazepam 10 mg tablet.  Please call.

## 2014-10-09 NOTE — Telephone Encounter (Signed)
Diazepam Rx was approved today along with Morphine and Oxycodone.  They should all be on the same Rx.  I called back.  Says he now realizes the Rx's are all together and asked that we disregard the call.

## 2014-11-05 ENCOUNTER — Telehealth: Payer: Self-pay | Admitting: Neurology

## 2014-11-05 NOTE — Telephone Encounter (Signed)
Rx's last written 05/24.  I called back.  Patient is aware Rx's will be written when due.

## 2014-11-05 NOTE — Telephone Encounter (Signed)
Patient called requesting refill for morphine (MS CONTIN) 30 MG 12 hr tablet and oxyCODONE (ROXICODONE) 15 MG immediate release tablet . Patient advised RX will be ready within 24 hrs unless otherwise informed by RN.

## 2014-11-06 ENCOUNTER — Telehealth: Payer: Self-pay

## 2014-11-06 ENCOUNTER — Other Ambulatory Visit: Payer: Self-pay

## 2014-11-06 MED ORDER — MORPHINE SULFATE ER 30 MG PO TBCR: 30.0000 mg | EXTENDED_RELEASE_TABLET | Freq: Two times a day (BID) | ORAL | Status: DC

## 2014-11-06 MED ORDER — OXYCODONE HCL 15 MG PO TABS: 15.0000 mg | ORAL_TABLET | Freq: Four times a day (QID) | ORAL | Status: DC | PRN

## 2014-11-06 NOTE — Telephone Encounter (Signed)
Rx ready for pick up. 

## 2014-12-03 ENCOUNTER — Telehealth: Payer: Self-pay

## 2014-12-03 ENCOUNTER — Other Ambulatory Visit: Payer: Self-pay | Admitting: Neurology

## 2014-12-03 DIAGNOSIS — Z0289 Encounter for other administrative examinations: Secondary | ICD-10-CM

## 2014-12-03 MED ORDER — MORPHINE SULFATE ER 30 MG PO TBCR: 30.0000 mg | EXTENDED_RELEASE_TABLET | Freq: Two times a day (BID) | ORAL | Status: DC

## 2014-12-03 MED ORDER — OXYCODONE HCL 15 MG PO TABS: 15.0000 mg | ORAL_TABLET | Freq: Four times a day (QID) | ORAL | Status: DC | PRN

## 2014-12-03 NOTE — Telephone Encounter (Signed)
Pt called to have Rx refill oxyCODONE (ROXICODONE) 15 MG immediate release tablet, morphine (MS CONTIN) 30 MG 12 hr tablet. May call pt [phone].

## 2014-12-03 NOTE — Telephone Encounter (Signed)
Rx ready for pick up. 

## 2014-12-21 ENCOUNTER — Telehealth: Payer: Self-pay | Admitting: Neurology

## 2014-12-21 NOTE — Telephone Encounter (Signed)
I called the patient. He is frustrated. He states he has had his daughter all summer and has not been able to do anything with her. He is in so much pain he cannot go outside of his house. He feels he is getting weaker and states he has been falling a lot. He would like to talk to Dr. Anne Hahn to see if there is anything else that could help him.

## 2014-12-21 NOTE — Telephone Encounter (Signed)
Patient called requesting to speak with Dr Anne Hahn. He would not do into detail. I ask him if was an emergency and he replied no but he needed to talk with the Dr. Please call and advise. Patient can be reached at [phone].

## 2014-12-21 NOTE — Telephone Encounter (Signed)
I called patient. The patient indicates that he has been sitting around all summer, has not been motivated to do anything. He was previously admitted to the psychiatric unit, he has been set up to see a psychiatrist, but he indicates that this is too expensive for him. The patient likely is having significant problems with depression. I have recommended physical therapy for him, he does not want to do this currently, he will try to get into the gym to exercise on a regular basis. Currently, depression is the most significant disability for him.

## 2014-12-31 ENCOUNTER — Telehealth: Payer: Self-pay | Admitting: Neurology

## 2014-12-31 NOTE — Telephone Encounter (Signed)
I called the patient back.  He will get Rx tomorrow.

## 2014-12-31 NOTE — Telephone Encounter (Signed)
Patient is calling to order 2 written Rx:  Oxycodone 15 mg immediate release; Morphine 30 mg 12 hr tablets.  Thanks!

## 2015-01-01 ENCOUNTER — Other Ambulatory Visit: Payer: Self-pay

## 2015-01-01 MED ORDER — OXYCODONE HCL 15 MG PO TABS: 15.0000 mg | ORAL_TABLET | Freq: Four times a day (QID) | ORAL | Status: DC | PRN

## 2015-01-01 MED ORDER — MORPHINE SULFATE ER 30 MG PO TBCR: 30.0000 mg | EXTENDED_RELEASE_TABLET | Freq: Two times a day (BID) | ORAL | Status: DC

## 2015-01-01 NOTE — Telephone Encounter (Signed)
Request has been approved by provider  

## 2015-01-28 ENCOUNTER — Telehealth: Payer: Self-pay

## 2015-01-28 ENCOUNTER — Other Ambulatory Visit: Payer: Self-pay | Admitting: Neurology

## 2015-01-28 MED ORDER — OXYCODONE HCL 15 MG PO TABS: 15.0000 mg | ORAL_TABLET | Freq: Four times a day (QID) | ORAL | Status: DC | PRN

## 2015-01-28 MED ORDER — MORPHINE SULFATE ER 30 MG PO TBCR: 30.0000 mg | EXTENDED_RELEASE_TABLET | Freq: Two times a day (BID) | ORAL | Status: DC

## 2015-01-28 NOTE — Telephone Encounter (Signed)
Rx ready for pick up. 

## 2015-01-28 NOTE — Telephone Encounter (Signed)
Patient called requesting refill for oxyCODONE (ROXICODONE) 15 MG immediate release tablet and morphine (MS CONTIN) 30 MG 12 hr tablet . Patient advised RX will be ready within 24 hours unless informed otherwise by RN

## 2015-01-28 NOTE — Telephone Encounter (Signed)
Requests entered, forwarded to provider for review.  Tomorrow will be the 28th day since last Rx.  Patient has a follow up appt scheduled next month.

## 2015-02-25 ENCOUNTER — Other Ambulatory Visit: Payer: Self-pay | Admitting: Neurology

## 2015-02-25 ENCOUNTER — Telehealth: Payer: Self-pay

## 2015-02-25 MED ORDER — OXYCODONE HCL 15 MG PO TABS: 15.0000 mg | ORAL_TABLET | Freq: Four times a day (QID) | ORAL | Status: DC | PRN

## 2015-02-25 MED ORDER — MORPHINE SULFATE ER 30 MG PO TBCR: 30.0000 mg | EXTENDED_RELEASE_TABLET | Freq: Two times a day (BID) | ORAL | Status: DC

## 2015-02-25 NOTE — Telephone Encounter (Signed)
Rx ready for pick up. 

## 2015-02-25 NOTE — Telephone Encounter (Signed)
Requests entered, forwarded to provider for approval.  Patient has appt scheduled this month  

## 2015-02-25 NOTE — Telephone Encounter (Signed)
Patient is calling to order 2 written prescriptions:  Oxycodone 15 mg and Morphine 30 mg.  Thanks!

## 2015-03-06 ENCOUNTER — Ambulatory Visit: Payer: Medicare Other | Admitting: Neurology

## 2015-03-25 ENCOUNTER — Telehealth: Payer: Self-pay | Admitting: Neurology

## 2015-03-25 MED ORDER — DIAZEPAM 10 MG PO TABS: 10.0000 mg | ORAL_TABLET | Freq: Three times a day (TID) | ORAL | Status: DC

## 2015-03-25 MED ORDER — MORPHINE SULFATE ER 30 MG PO TBCR: 30.0000 mg | EXTENDED_RELEASE_TABLET | Freq: Two times a day (BID) | ORAL | Status: DC

## 2015-03-25 MED ORDER — OXYCODONE HCL 15 MG PO TABS: 15.0000 mg | ORAL_TABLET | Freq: Four times a day (QID) | ORAL | Status: DC | PRN

## 2015-03-25 NOTE — Telephone Encounter (Signed)
Requests entered, forwarded to provider for review.  

## 2015-03-25 NOTE — Telephone Encounter (Signed)
Pt called requesting refill for oxyCODONE (ROXICODONE) 15 MG immediate release tablet and morphine (MS CONTIN) 30 MG 12 hr tablet . He also needs diazepam (VALIUM) 10 MG tablet called to [facility]. Pt advised RX will ready within 24 hrs unless informed otherwise by RN

## 2015-03-26 ENCOUNTER — Telehealth: Payer: Self-pay | Admitting: Neurology

## 2015-03-26 NOTE — Telephone Encounter (Addendum)
Pt called wanting to know if his Rx was ready. Please call and advise [phone]

## 2015-03-26 NOTE — Telephone Encounter (Signed)
Called and spoke to patient relayed his RX was ready for pick up. Patient relayed his father will pick up Jason Dominguez relayed office hours patient understood.

## 2015-04-17 ENCOUNTER — Telehealth: Payer: Self-pay

## 2015-04-17 NOTE — Telephone Encounter (Signed)
Patient called to let Dr. Anne HahnWillis know that he fell last night. He states he just felt weak and got hung up in his walker. EMS came and helped him up. They checked him out. Patient did not want to go to the hospital. He states that his pain is not being controlled. He feels like he has built a tolerance up to his pain medication. He is tearful and discouraged. He has an appointment with a crisis center 12/7. He states that everyone he has talked to advised he call and talk to Dr. Anne HahnWillis. He states Dr. Anne HahnWillis is the only doctor he has and he would like to speak to him about his situation.

## 2015-04-17 NOTE — Telephone Encounter (Signed)
I called patient. The patient has had a recent fall, did not sustain significant injury. The has contacted the crisis center, they will get him set [place] with a [place]. He is having increased pain, decreased mobility. They will try to get him set up for physical therapy, he needs to have a referral to a competent pain center. I am in full support of what they are doing.

## 2015-04-22 ENCOUNTER — Other Ambulatory Visit: Payer: Self-pay | Admitting: Neurology

## 2015-04-22 ENCOUNTER — Telehealth: Payer: Self-pay

## 2015-04-22 MED ORDER — OXYCODONE HCL 15 MG PO TABS: 15.0000 mg | ORAL_TABLET | Freq: Four times a day (QID) | ORAL | Status: DC | PRN

## 2015-04-22 MED ORDER — MORPHINE SULFATE ER 30 MG PO TBCR: 30.0000 mg | EXTENDED_RELEASE_TABLET | Freq: Two times a day (BID) | ORAL | Status: DC

## 2015-04-22 NOTE — Telephone Encounter (Signed)
Requests entered, forwarded to provider for review.   Patient has follow up scheduled in March

## 2015-04-22 NOTE — Telephone Encounter (Signed)
Pt called requesting refill for morphine (MS CONTIN) 30 MG 12 hr tablet and oxyCODONE (ROXICODONE) 15 MG immediate release tablet . Pt advised RX will be ready within 24 hrs unless informed otherwise by RN

## 2015-04-22 NOTE — Telephone Encounter (Signed)
Rx ready for pick up. 

## 2015-05-15 ENCOUNTER — Telehealth: Payer: Self-pay | Admitting: Neurology

## 2015-05-15 DIAGNOSIS — R269 Unspecified abnormalities of gait and mobility: Secondary | ICD-10-CM

## 2015-05-15 DIAGNOSIS — G8220 Paraplegia, unspecified: Secondary | ICD-10-CM

## 2015-05-15 DIAGNOSIS — M4714 Other spondylosis with myelopathy, thoracic region: Secondary | ICD-10-CM

## 2015-05-15 NOTE — Telephone Encounter (Signed)
Pt called and says that he has "taken a turn for the worse." He says that his pain level is horrible. He is considering a nursing home because he can not get up and move. He just doesn't know what to do any more. Please call and advise [phone].

## 2015-05-15 NOTE — Telephone Encounter (Signed)
Chart reviewed, he was patient of Dr. Anne HahnWillis,  I have called him, he complains of severe upper back pain, he has tried different medication in the past, includes gabapentin, Lyrica, narcortics. He could not go out with his family, "only reason I stick up here is I have daughter".    He is consider nursing home admission, I have suggested him to call ambulance for potential hospital admission if he is having severe depression, in severe pain,

## 2015-05-21 ENCOUNTER — Telehealth: Payer: Self-pay

## 2015-05-21 ENCOUNTER — Other Ambulatory Visit: Payer: Self-pay | Admitting: Neurology

## 2015-05-21 MED ORDER — OXYCODONE HCL 15 MG PO TABS: 15.0000 mg | ORAL_TABLET | Freq: Four times a day (QID) | ORAL | Status: DC | PRN

## 2015-05-21 MED ORDER — MORPHINE SULFATE ER 30 MG PO TBCR: 30.0000 mg | EXTENDED_RELEASE_TABLET | Freq: Two times a day (BID) | ORAL | Status: DC

## 2015-05-21 NOTE — Telephone Encounter (Signed)
Rx ready for pick up. 

## 2015-05-21 NOTE — Telephone Encounter (Signed)
Request entered, forwarded to provider for review.   Patient has appt scheduled in March

## 2015-05-21 NOTE — Telephone Encounter (Signed)
Patient is calling to order 2 written Rx:  Morphine 30 mg 12 hr tablet.  Oxycodone 15 mg immediate release.  Thanks!

## 2015-06-17 ENCOUNTER — Other Ambulatory Visit: Payer: Self-pay | Admitting: Neurology

## 2015-06-17 ENCOUNTER — Telehealth: Payer: Self-pay

## 2015-06-17 MED ORDER — OXYCODONE HCL 15 MG PO TABS: 15.0000 mg | ORAL_TABLET | Freq: Four times a day (QID) | ORAL | Status: DC | PRN

## 2015-06-17 MED ORDER — MORPHINE SULFATE ER 30 MG PO TBCR: 30.0000 mg | EXTENDED_RELEASE_TABLET | Freq: Two times a day (BID) | ORAL | Status: DC

## 2015-06-17 NOTE — Telephone Encounter (Signed)
Rx ready for pick up. 

## 2015-06-17 NOTE — Telephone Encounter (Signed)
Patient called to request refills of oxyCODONE (ROXICODONE) 15 MG immediate release tablet and morphine (MS CONTIN) 30 MG 12 hr tablet

## 2015-06-17 NOTE — Telephone Encounter (Signed)
Tomorrow is the 28th day.  Patient has appt. 03/03.  Request forwarded to provider for review.

## 2015-07-04 DIAGNOSIS — M549 Dorsalgia, unspecified: Secondary | ICD-10-CM | POA: Diagnosis not present

## 2015-07-04 DIAGNOSIS — Z9889 Other specified postprocedural states: Secondary | ICD-10-CM | POA: Diagnosis not present

## 2015-07-04 DIAGNOSIS — R252 Cramp and spasm: Secondary | ICD-10-CM | POA: Diagnosis not present

## 2015-07-04 DIAGNOSIS — I10 Essential (primary) hypertension: Secondary | ICD-10-CM | POA: Diagnosis not present

## 2015-07-04 DIAGNOSIS — Z79891 Long term (current) use of opiate analgesic: Secondary | ICD-10-CM | POA: Diagnosis not present

## 2015-07-04 DIAGNOSIS — M545 Low back pain: Secondary | ICD-10-CM | POA: Diagnosis not present

## 2015-07-04 DIAGNOSIS — G8929 Other chronic pain: Secondary | ICD-10-CM | POA: Diagnosis not present

## 2015-07-15 ENCOUNTER — Telehealth: Payer: Self-pay | Admitting: Neurology

## 2015-07-15 ENCOUNTER — Telehealth: Payer: Self-pay | Admitting: *Deleted

## 2015-07-15 MED ORDER — OXYCODONE HCL 15 MG PO TABS: 15.0000 mg | ORAL_TABLET | Freq: Four times a day (QID) | ORAL | Status: DC | PRN

## 2015-07-15 MED ORDER — MORPHINE SULFATE ER 30 MG PO TBCR: 30.0000 mg | EXTENDED_RELEASE_TABLET | Freq: Two times a day (BID) | ORAL | Status: DC

## 2015-07-15 NOTE — Telephone Encounter (Signed)
Pt needs rx refill on oxyCODONE (ROXICODONE) 15 MG immediate release tablet and morphine (MS CONTIN) 30 MG 12 hr tablet. Thank you

## 2015-07-15 NOTE — Telephone Encounter (Signed)
I will refill the prescription, the patient has a revisit on March 3.

## 2015-07-15 NOTE — Telephone Encounter (Signed)
Oxycodone and Morphine rx's up front [facility]

## 2015-07-17 ENCOUNTER — Telehealth: Payer: Self-pay | Admitting: Neurology

## 2015-07-17 NOTE — Telephone Encounter (Signed)
error 

## 2015-07-19 ENCOUNTER — Ambulatory Visit (INDEPENDENT_AMBULATORY_CARE_PROVIDER_SITE_OTHER): Payer: Medicare Other | Admitting: Neurology

## 2015-07-19 ENCOUNTER — Encounter: Payer: Self-pay | Admitting: Neurology

## 2015-07-19 VITALS — BP 143/92 | HR 83 | Wt 174.0 lb

## 2015-07-19 DIAGNOSIS — R269 Unspecified abnormalities of gait and mobility: Secondary | ICD-10-CM

## 2015-07-19 DIAGNOSIS — M4714 Other spondylosis with myelopathy, thoracic region: Secondary | ICD-10-CM

## 2015-07-19 DIAGNOSIS — F329 Major depressive disorder, single episode, unspecified: Secondary | ICD-10-CM

## 2015-07-19 DIAGNOSIS — F32A Depression, unspecified: Secondary | ICD-10-CM

## 2015-07-19 DIAGNOSIS — F4542 Pain disorder with related psychological factors: Secondary | ICD-10-CM | POA: Diagnosis not present

## 2015-07-19 MED ORDER — FENTANYL 75 MCG/HR TD PT72: 75.0000 ug | MEDICATED_PATCH | TRANSDERMAL | Status: DC

## 2015-07-19 NOTE — Progress Notes (Signed)
Reason for visit: Chronic pain  Jason Dominguez is an 35 y.o. male  History of present illness:  Jason Dominguez is a 35 year old right-handed white male with a history of a spinal cord injury with a paraparesis. The patient has chronic pain associated with this. He has spasticity of the legs, right greater than left, and an associated gait disorder. [place], he is coming up here for pain management. I have indicated that he needs to get in touch with a pain center in his area. He has a lot of underlying depression. The patient has contacted a crisis center, he has hooked up with a psychology service and some physical therapy in his area. He has not yet made contact with a pain center. He is on Wellbutrin and diazepam. He takes MS Contin 30 mg twice daily, and oxycodone for breakthrough pain. He indicates that the MS Contin does not work for him. He is taking baclofen 20 mg, 4 tablets daily. This is not controlling his spasticity. He does report one fall since last seen. He returns this office for an evaluation.  Past Medical History  Diagnosis Date  . Drug abuse   . Alcohol abuse   . Paraplegic spinal paralysis (HCC)     per pt since accident 05/10/2011  . Chronic back pain   . Hypertension   . Anxiety   . Depression   . History of kidney stones   . Gait disorder   . Thoracic spondylosis with myelopathy 10/03/2012    Past Surgical History  Procedure Laterality Date  . Back surgery      T4 spinal fusion with Harrington rods  . Hernia repair      stomach per pt  . Mandible fracture surgery      Cosmetic     History reviewed. No pertinent family history.  Social history:  reports that he has been smoking Cigarettes.  He has a 36 pack-year smoking history. He has never used smokeless tobacco. He reports that he does not drink alcohol or use illicit drugs.   No Known Allergies  Medications:  Prior to Admission medications   Medication Sig  Start Date End Date Taking? Authorizing Provider  baclofen (LIORESAL) 20 MG tablet One tablet twice daily and two at bedtime 05/02/14   York Spanielharles K Texie Tupou, MD  buPROPion (WELLBUTRIN XL) 150 MG 24 hr tablet One tablet daily for 4 weeks, then take 2 tablets daily 08/29/14   York Spanielharles K Megann Easterwood, MD  diazepam (VALIUM) 10 MG tablet Take 1 tablet (10 mg total) by mouth 3 (three) times daily. 03/25/15   York Spanielharles K Greysin Medlen, MD  morphine (MS CONTIN) 30 MG 12 hr tablet Take 1 tablet (30 mg total) by mouth 2 (two) times daily. 07/15/15   York Spanielharles K Aeris Hersman, MD  oxyCODONE (ROXICODONE) 15 MG immediate release tablet Take 1 tablet (15 mg total) by mouth every 6 (six) hours as needed for pain (Must last 28 days). 07/15/15   York Spanielharles K Lanna Labella, MD    ROS:  Out of a complete 14 system review of symptoms, the patient complains only of the following symptoms, and all other reviewed systems are negative.  Decreased activity and appetite, fatigue Rectal pain, incontinence of bowels Difficulty urinating, painful urination, incontinence of bladder, frequency of urination, urinary urgency, decreased urine Back pain, achy muscles, muscle cramps, walking difficulty, neck pain Numbness, weakness Agitation, depression, anxiety  Blood pressure 143/92, pulse 83, weight 174 lb (  78.926 kg).  Physical Exam  General: The patient is alert and cooperative at the time of the examination.  Skin: No significant peripheral edema is noted.   Neurologic Exam  Mental status: The patient is alert and oriented x 3 at the time of the examination. The patient has apparent normal recent and remote memory, with an apparently normal attention span and concentration ability.   Cranial nerves: Facial symmetry is present. Speech is normal, no aphasia or dysarthria is noted. Extraocular movements are full, But with superior gaze, there is exotropia of the right eye. Visual fields are full.  Motor: The patient has good strength in the upper  extremities. With the lower extremities, the patient has increased tone with the right leg, 3/5 strength, 4/5 strength on the left.  Sensory examination: Soft touch sensation is symmetric on the face, arms, and legs.  Coordination: The patient has good finger-nose-finger bilaterally. The patient is able to perform heel-to-shin on the left leg, difficulty with the right.   Gait and station: The patient is able to walk short distances with assistance. Gait is wide-based, short steps.  Reflexes: Deep tendon reflexes are symmetric.   Assessment/Plan:  1. Thoracic myelopathy   2. Gait disorder   3. Chronic pain disorder   4. Depression   The patient will be switched from MS Contin to a fentanyl patch, 75 g patch will be started. The patient will take oxycodone for breakthrough pain. He is to contact a pain center in his area, I will make the referral. He will follow-up in 6 months. The patient may be a good candidate for a morphine pump, or for a spinal stimulator.    Marlan Palau MD 07/19/2015 3:23 PM  [facility] [address] [place], [place] [address]  Phone [phone] Fax [phone]

## 2015-07-19 NOTE — Patient Instructions (Signed)
Fall Prevention in the Home  Falls can cause injuries and can affect people from all age groups. There are many simple things that you can do to make your home safe and to help prevent falls. WHAT CAN I DO ON THE OUTSIDE OF MY HOME?  Regularly repair the edges of walkways and driveways and fix any cracks.  Remove high doorway thresholds.  Trim any shrubbery on the main path into your home.  Use bright outdoor lighting.  Clear walkways of debris and clutter, including tools and rocks.  Regularly check that handrails are securely fastened and in good repair. Both sides of any steps should have handrails.  Install guardrails along the edges of any raised decks or porches.  Have leaves, snow, and ice cleared regularly.  Use sand or salt on walkways during winter months.  In the garage, clean up any spills right away, including grease or oil spills. WHAT CAN I DO IN THE BATHROOM?  Use night lights.  Install grab bars by the toilet and in the tub and shower. Do not use towel bars as grab bars.  Use non-skid mats or decals on the floor of the tub or shower.  If you need to sit down while you are in the shower, use a plastic, non-slip stool..  Keep the floor dry. Immediately clean up any water that spills on the floor.  Remove soap buildup in the tub or shower on a regular basis.  Attach bath mats securely with double-sided non-slip rug tape.  Remove throw rugs and other tripping hazards from the floor. WHAT CAN I DO IN THE BEDROOM?  Use night lights.  Make sure that a bedside light is easy to reach.  Do not use oversized bedding that drapes onto the floor.  Have a firm chair that has side arms to use for getting dressed.  Remove throw rugs and other tripping hazards from the floor. WHAT CAN I DO IN THE KITCHEN?   Clean up any spills right away.  Avoid walking on wet floors.  Place frequently used items in easy-to-reach places.  If you need to reach for something  above you, use a sturdy step stool that has a grab bar.  Keep electrical cables out of the way.  Do not use floor polish or wax that makes floors slippery. If you have to use wax, make sure that it is non-skid floor wax.  Remove throw rugs and other tripping hazards from the floor. WHAT CAN I DO IN THE STAIRWAYS?  Do not leave any items on the stairs.  Make sure that there are handrails on both sides of the stairs. Fix handrails that are broken or loose. Make sure that handrails are as long as the stairways.  Check any carpeting to make sure that it is firmly attached to the stairs. Fix any carpet that is loose or worn.  Avoid having throw rugs at the top or bottom of stairways, or secure the rugs with carpet tape to prevent them from moving.  Make sure that you have a light switch at the top of the stairs and the bottom of the stairs. If you do not have them, have them installed. WHAT ARE SOME OTHER FALL PREVENTION TIPS?  Wear closed-toe shoes that fit well and support your feet. Wear shoes that have rubber soles or low heels.  When you use a stepladder, make sure that it is completely opened and that the sides are firmly locked. Have someone hold the ladder while you   are using it. Do not climb a closed stepladder.  Add color or contrast paint or tape to grab bars and handrails in your home. Place contrasting color strips on the first and last steps.  Use mobility aids as needed, such as canes, walkers, scooters, and crutches.  Turn on lights if it is dark. Replace any light bulbs that burn out.  Set up furniture so that there are clear paths. Keep the furniture in the same spot.  Fix any uneven floor surfaces.  Choose a carpet design that does not hide the edge of steps of a stairway.  Be aware of any and all pets.  Review your medicines with your healthcare provider. Some medicines can cause dizziness or changes in blood pressure, which increase your risk of falling. Talk  with your health care provider about other ways that you can decrease your risk of falls. This may include working with a physical therapist or trainer to improve your strength, balance, and endurance.   This information is not intended to replace advice given to you by your health care provider. Make sure you discuss any questions you have with your health care provider.   Document Released: 04/24/2002 Document Revised: 09/18/2014 Document Reviewed: 06/08/2014 Elsevier Interactive Patient Education 2016 [employer]vier Inc.  

## 2015-08-12 ENCOUNTER — Other Ambulatory Visit: Payer: Self-pay | Admitting: Neurology

## 2015-08-12 ENCOUNTER — Telehealth: Payer: Self-pay | Admitting: *Deleted

## 2015-08-12 MED ORDER — OXYCODONE HCL 15 MG PO TABS: 15.0000 mg | ORAL_TABLET | Freq: Four times a day (QID) | ORAL | Status: DC | PRN

## 2015-08-12 MED ORDER — FENTANYL 75 MCG/HR TD PT72: 75.0000 ug | MEDICATED_PATCH | TRANSDERMAL | Status: DC

## 2015-08-12 NOTE — Telephone Encounter (Signed)
I spoke to pt , but he also wants to speak to you.

## 2015-08-12 NOTE — Telephone Encounter (Signed)
Spoke to pt to pick up prescriptions.

## 2015-08-12 NOTE — Telephone Encounter (Signed)
I called patient. He indicates that he still requires oxycodone on a regular basis, getting at least 3 tablets in a day. This indicates that we may need to go up on the fentanyl patch to the 100 g patch on the next refill. The patient is somewhat anxious that he does not believe that he can come off of the oxycodone.

## 2015-08-12 NOTE — Telephone Encounter (Signed)
Patient is calling as he needs to order the following 2 written Rx:  Oxycodone 15 mg immediate release tablets (to pick up tomorrow) Rx fentaNYL 75 MCG/HR (to pick up Wednesday).   Patient expressed appreciation and states now that he is taking these medications he feels the best that he has in 4 years!!

## 2015-08-13 ENCOUNTER — Telehealth: Payer: Self-pay | Admitting: *Deleted

## 2015-08-13 DIAGNOSIS — F32A Depression, unspecified: Secondary | ICD-10-CM

## 2015-08-13 DIAGNOSIS — G8929 Other chronic pain: Secondary | ICD-10-CM

## 2015-08-13 DIAGNOSIS — G894 Chronic pain syndrome: Secondary | ICD-10-CM

## 2015-08-13 DIAGNOSIS — M4714 Other spondylosis with myelopathy, thoracic region: Secondary | ICD-10-CM

## 2015-08-13 DIAGNOSIS — R1013 Epigastric pain: Secondary | ICD-10-CM

## 2015-08-13 DIAGNOSIS — F329 Major depressive disorder, single episode, unspecified: Secondary | ICD-10-CM

## 2015-08-13 NOTE — Telephone Encounter (Signed)
I tried to call the patient, no answer, unable to leave a message. I had discussion with him yesterday, I indicated that we would focus on pain control, using the fentanyl patch as a primary source of pain control, using oxycodone for breakthrough pain. The patient seems to be more focused on how many oxycodone tablets to getting a month rather than the overall plan to control his pain. This would make me concerned that he may be using the oxycodone recreationally, or may be selling the medication. Clearly, this patient is emotionally unstable, I feel better about reducing the oral tablets, using the patch as a primary source for pain control, as it would be harder for him to overdose on the fentanyl patch.

## 2015-08-13 NOTE — Telephone Encounter (Signed)
Lori from phone room, let me know pt on line.   He was stating that he was saying life was not worth living.  He is anxious about getting lower dose of oxycodone.  He had spoken to Dr. Anne HahnWillis yesterday about his plan of care.   He states that he see's when in [place] person at crisis center (twice week).  He is visiting his parents now and is alone.   I called and spoke to mother who was with pt at this time.  She stated that  her husband who was at appointment when pt in last states that they were not told about decreasing of oxy.  Pt crying, anxious, having diarrhea, feeling like he cannot be a good father to his daughter.  He talked about seeing another MD, and I stated that if he did not agree with Dr. Clarisa KindredWillis's plan of care then he may need to do this.  They would like a call back.

## 2015-08-13 NOTE — Telephone Encounter (Signed)
Patient called back to advise, has been going through recent medication changes and it's not working. States he is going through detox symptoms. States "maybe he needs to find somebody else. Doesn't know what to do. He states that he can't live like this or he will take his life". Patient is tearful.

## 2015-08-14 NOTE — Telephone Encounter (Signed)
Pt called said he is requesting Dr Anne HahnWillis to refer him to [facility] in [place], [place]. (f) [phone]. Pt sts that he and Dr Anne HahnWillis "had it out yesterday" and Dr Anne HahnWillis questioned his religion. York SpanielSaid he had never been talked to that way.

## 2015-08-14 NOTE — Telephone Encounter (Signed)
I did not call the patient back. I will refer him to a pain center, I do think this is the right step. On our last conversation, we never discussed religion, the patient clearly is emotionally unstable at this point.

## 2015-08-14 NOTE — Addendum Note (Signed)
Addended by: Stephanie AcreWILLIS, Jalissa Heinzelman on: 08/14/2015 07:09 PM   Modules accepted: Orders

## 2015-08-14 NOTE — Addendum Note (Signed)
Addended byHermenia Fiscal: Karin Pinedo on: 08/14/2015 03:58 PM   Modules accepted: Orders

## 2015-08-15 ENCOUNTER — Telehealth: Payer: Self-pay | Admitting: Neurology

## 2015-08-15 DIAGNOSIS — M546 Pain in thoracic spine: Secondary | ICD-10-CM | POA: Diagnosis not present

## 2015-08-15 DIAGNOSIS — G8921 Chronic pain due to trauma: Secondary | ICD-10-CM | POA: Diagnosis not present

## 2015-08-15 DIAGNOSIS — R262 Difficulty in walking, not elsewhere classified: Secondary | ICD-10-CM | POA: Diagnosis not present

## 2015-08-15 NOTE — Telephone Encounter (Signed)
Referral was sent fax [phone]. Pain Mgt.

## 2015-08-15 NOTE — Telephone Encounter (Signed)
Pt called said he called [facility] and referral had not been rec'd. I explained to the pt it will take 2-3 days before referral is sent and to check back with them on Tuesday 08/20/15. Pt understood

## 2015-08-15 NOTE — Telephone Encounter (Signed)
Patient is calling to discuss the referral to a pain clinic.

## 2015-08-20 NOTE — Telephone Encounter (Signed)
error 

## 2015-08-20 NOTE — Telephone Encounter (Signed)
I see faxed records to PM for referral.

## 2015-08-20 NOTE — Telephone Encounter (Addendum)
Pt said it could take 2wk - 1 mth for ininital visit. He has been established with PCP. He will run out of fentaNYL (DURAGESIC) 75 MCG/HR and   oxyCODONE (ROXICODONE) 15 MG immediate release tablet on 09/12/15 and would like Dr Anne HahnWillis to refill these medications. Pt said if he is not accepted itnto pain clinic would Dr Anne HahnWillis continue to refill pain medication until he could get established in a clinic.

## 2015-08-20 NOTE — Telephone Encounter (Signed)
The patient has gotten these prescriptions on March 27, I'll be happy to continue to refill the prescriptions until he gets into the pain center.

## 2015-08-21 NOTE — Telephone Encounter (Signed)
Pt called this morning, I relayed Dr Anne HahnWillis msg. Pt was thankful.

## 2015-08-21 NOTE — Telephone Encounter (Signed)
I will give the patient pain medications until he can get into a pain center.

## 2015-08-26 DIAGNOSIS — W06XXXA Fall from bed, initial encounter: Secondary | ICD-10-CM | POA: Diagnosis not present

## 2015-08-26 DIAGNOSIS — G8929 Other chronic pain: Secondary | ICD-10-CM | POA: Diagnosis not present

## 2015-08-26 DIAGNOSIS — M546 Pain in thoracic spine: Secondary | ICD-10-CM | POA: Diagnosis not present

## 2015-08-26 DIAGNOSIS — S24109A Unspecified injury at unspecified level of thoracic spinal cord, initial encounter: Secondary | ICD-10-CM | POA: Diagnosis not present

## 2015-08-26 DIAGNOSIS — Z79899 Other long term (current) drug therapy: Secondary | ICD-10-CM | POA: Diagnosis not present

## 2015-08-26 DIAGNOSIS — M549 Dorsalgia, unspecified: Secondary | ICD-10-CM | POA: Diagnosis not present

## 2015-08-27 ENCOUNTER — Telehealth: Payer: Self-pay | Admitting: Neurology

## 2015-08-27 MED ORDER — METHADONE HCL 10 MG PO TABS: 10.0000 mg | ORAL_TABLET | Freq: Two times a day (BID) | ORAL | Status: DC

## 2015-08-27 NOTE — Telephone Encounter (Signed)
I called the patient. The patient is doing well with the fentanyl, he is withdrawn from and on the third day, and he has developed a rash with patches. We will stop the fentanyl patch, overt him to methadone. He will start at 10 mg twice daily.

## 2015-08-27 NOTE — Telephone Encounter (Signed)
Patient is calling and states he is very sick(he sounded very shaky and almost crying) and cannot get out of bed. He has diarrhea and yesterday had a serious fall and heard something pop in his back.  He says the Rx fentaNYL 75 MCG is not working for him. He is requesting a call back from Dr.[name] [phone]. Thanks!

## 2015-08-28 DIAGNOSIS — M546 Pain in thoracic spine: Secondary | ICD-10-CM | POA: Diagnosis not present

## 2015-08-28 DIAGNOSIS — G8921 Chronic pain due to trauma: Secondary | ICD-10-CM | POA: Diagnosis not present

## 2015-08-28 DIAGNOSIS — R262 Difficulty in walking, not elsewhere classified: Secondary | ICD-10-CM | POA: Diagnosis not present

## 2015-08-28 NOTE — Telephone Encounter (Signed)
Rx printed, signed, up front for pick-up. 

## 2015-09-03 NOTE — Addendum Note (Signed)
Addended by: Stephanie AcreWILLIS, CHARLES on: 09/03/2015 05:43 PM   Modules accepted: Medications

## 2015-09-03 NOTE — Telephone Encounter (Signed)
I called the patient. The patient is not doing well with the pain, the 20 mg of the methadone is not holding him. We will go up to 30 mg daily. We will refill the prescriptions next Monday on April 25. We will keep the oxycodone to 90 tablets a month, plan to reduce the amount of tablets once we get the methadone dosing in the right range. The patient may not be up to see a pain center within the next 2 months.

## 2015-09-03 NOTE — Telephone Encounter (Addendum)
Pt called said he is not doing well with Methadone. York SpanielSaid he is not able to get to bathroom in time, he has messed his rugs up, he's had to call his parents to come and stay with him. He is requesting to go back to Morphine 30mg  2 x day #60. He wants to go back to Oxycodone 4 x day prn # 120 also. He wants to go back on these medications that worked well for 2 years before. He said this worked well and would like RX for these until he can get into the pain clinic. He said right now he is taking methadone and oxycodone as prescribed.

## 2015-09-09 ENCOUNTER — Telehealth: Payer: Self-pay | Admitting: Neurology

## 2015-09-09 MED ORDER — METHADONE HCL 10 MG PO TABS: 10.0000 mg | ORAL_TABLET | Freq: Three times a day (TID) | ORAL | Status: DC

## 2015-09-09 MED ORDER — DIAZEPAM 10 MG PO TABS: 10.0000 mg | ORAL_TABLET | Freq: Three times a day (TID) | ORAL | Status: DC

## 2015-09-09 MED ORDER — OXYCODONE HCL 15 MG PO TABS: 15.0000 mg | ORAL_TABLET | Freq: Four times a day (QID) | ORAL | Status: DC | PRN

## 2015-09-09 NOTE — Telephone Encounter (Signed)
The prescription for the diazepam was refilled.

## 2015-09-09 NOTE — Telephone Encounter (Signed)
Patient is calling to order 2 written Rx:  Oxycodone 15 mg immediate release tablets.  Methadone 10 mg(up dosage to 3 per day). Patient states that the Methadone 10 mg (2 times per day) is not taking care of his pain. Thanks!

## 2015-09-09 NOTE — Telephone Encounter (Signed)
error 

## 2015-09-09 NOTE — Telephone Encounter (Signed)
Pt called request refill for diazepam (VALIUM) 10 MG tablet . Please send to [facility] [place]

## 2015-09-09 NOTE — Telephone Encounter (Signed)
Last filled 03/25/15 w/ 5 refills

## 2015-09-10 NOTE — Telephone Encounter (Signed)
Pt called inquiring about RX's. Operator relayed to pt RX's are not ready and to call back in a few hours.

## 2015-09-10 NOTE — Telephone Encounter (Signed)
RXs printed, signed, up front for pt pick-up.

## 2015-09-10 NOTE — Telephone Encounter (Signed)
Pt notified by TC that scripts are available for pick-up.

## 2015-10-07 ENCOUNTER — Other Ambulatory Visit: Payer: Self-pay | Admitting: *Deleted

## 2015-10-07 MED ORDER — OXYCODONE HCL 15 MG PO TABS: 15.0000 mg | ORAL_TABLET | Freq: Four times a day (QID) | ORAL | Status: DC | PRN

## 2015-10-07 MED ORDER — METHADONE HCL 10 MG PO TABS: 10.0000 mg | ORAL_TABLET | Freq: Three times a day (TID) | ORAL | Status: DC

## 2015-10-07 NOTE — Telephone Encounter (Signed)
Last OV 07/19/15 w/ f/u scheduled 01/17/16 Last refilled 09/09/15

## 2015-10-07 NOTE — Telephone Encounter (Signed)
RXs printed, signed, up front for pick-up. 

## 2015-11-04 ENCOUNTER — Emergency Department (HOSPITAL_COMMUNITY)
Admission: EM | Admit: 2015-11-04 | Discharge: 2015-11-04 | Disposition: A | Discharge status: Home / Self Care | Payer: Medicare Other | Attending: Emergency Medicine | Admitting: Emergency Medicine

## 2015-11-04 ENCOUNTER — Other Ambulatory Visit: Payer: Self-pay | Admitting: Neurology

## 2015-11-04 ENCOUNTER — Encounter (HOSPITAL_COMMUNITY): Payer: Self-pay | Admitting: Emergency Medicine

## 2015-11-04 DIAGNOSIS — F1193 Opioid use, unspecified with withdrawal: Secondary | ICD-10-CM

## 2015-11-04 DIAGNOSIS — F1123 Opioid dependence with withdrawal: Secondary | ICD-10-CM

## 2015-11-04 DIAGNOSIS — R112 Nausea with vomiting, unspecified: Secondary | ICD-10-CM | POA: Diagnosis present

## 2015-11-04 DIAGNOSIS — F17210 Nicotine dependence, cigarettes, uncomplicated: Secondary | ICD-10-CM | POA: Diagnosis not present

## 2015-11-04 DIAGNOSIS — I10 Essential (primary) hypertension: Secondary | ICD-10-CM | POA: Insufficient documentation

## 2015-11-04 MED ORDER — LOPERAMIDE HCL 2 MG PO CAPS
4.0000 mg | ORAL_CAPSULE | Freq: Once | ORAL | Status: AC
  Administered 2015-11-04: 4 mg via ORAL
  Filled 2015-11-04: qty 2

## 2015-11-04 MED ORDER — OXYCODONE HCL ER 15 MG PO T12A
15.0000 mg | EXTENDED_RELEASE_TABLET | Freq: Two times a day (BID) | ORAL | Status: DC
  Filled 2015-11-04: qty 1

## 2015-11-04 MED ORDER — DIAZEPAM 5 MG PO TABS
10.0000 mg | ORAL_TABLET | Freq: Once | ORAL | Status: AC
  Administered 2015-11-04: 10 mg via ORAL
  Filled 2015-11-04: qty 2

## 2015-11-04 MED ORDER — ONDANSETRON 4 MG PO TBDP
4.0000 mg | ORAL_TABLET | Freq: Once | ORAL | Status: AC
  Administered 2015-11-04: 4 mg via ORAL
  Filled 2015-11-04: qty 1

## 2015-11-04 MED ORDER — OXYCODONE HCL ER 15 MG PO T12A
15.0000 mg | EXTENDED_RELEASE_TABLET | Freq: Once | ORAL | Status: AC
  Administered 2015-11-04: 15 mg via ORAL
  Filled 2015-11-04: qty 1

## 2015-11-04 MED ORDER — ONDANSETRON HCL 4 MG/2ML IJ SOLN: 4.0000 mg | Freq: Once | INTRAMUSCULAR | Status: DC

## 2015-11-04 MED ORDER — ONDANSETRON 4 MG PO TBDP: 4.0000 mg | ORAL_TABLET | Freq: Three times a day (TID) | ORAL | Status: DC | PRN

## 2015-11-04 MED ORDER — DIAZEPAM 5 MG PO TABS
5.0000 mg | ORAL_TABLET | Freq: Two times a day (BID) | ORAL | Status: DC | PRN
Start: 2015-11-04 — End: 2015-11-26

## 2015-11-04 MED ORDER — OXYCODONE-ACETAMINOPHEN 5-325 MG PO TABS: 2.0000 | ORAL_TABLET | ORAL | Status: DC | PRN

## 2015-11-04 MED ORDER — LOPERAMIDE HCL 2 MG PO CAPS: 4.0000 mg | ORAL_CAPSULE | Freq: Four times a day (QID) | ORAL | Status: DC | PRN

## 2015-11-04 NOTE — Telephone Encounter (Signed)
Patient requesting refill of methadone (DOLOPHINE) 10 MG tablet and oxyCODONE (ROXICODONE) 15 MG immediate release tablet. ° °

## 2015-11-04 NOTE — Telephone Encounter (Signed)
Pt seen in ER early this morning w/ diagnosis of opioid withdrawal.  ED notes...  "Pt reports that he is on methadone, oxycodone, valium, baclofen[place]. As a result, he is having withdrawal symptoms. Requesting meds to get him into Tuesday, since his pain doctor will write him a rx that can be filled on Tuesday."  Last OV 07/19/15 for chronic pain and depression. F/u scheduled 01/17/16. Last refill 10/07/15
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
## 2015-11-04 NOTE — ED Notes (Signed)
Pt departed in NAD.  

## 2015-11-05 MED ORDER — OXYCODONE HCL 15 MG PO TABS: 15.0000 mg | ORAL_TABLET | Freq: Four times a day (QID) | ORAL | Status: DC | PRN

## 2015-11-05 MED ORDER — METHADONE HCL 10 MG PO TABS: 10.0000 mg | ORAL_TABLET | Freq: Three times a day (TID) | ORAL | Status: DC

## 2015-11-05 NOTE — Telephone Encounter (Signed)
RXs printed, signed, up front for pick-up. 

## 2015-11-17 DIAGNOSIS — S79911A Unspecified injury of right hip, initial encounter: Secondary | ICD-10-CM | POA: Diagnosis not present

## 2015-11-17 DIAGNOSIS — R269 Unspecified abnormalities of gait and mobility: Secondary | ICD-10-CM | POA: Diagnosis not present

## 2015-11-17 DIAGNOSIS — Z79891 Long term (current) use of opiate analgesic: Secondary | ICD-10-CM | POA: Diagnosis not present

## 2015-11-17 DIAGNOSIS — S80812A Abrasion, left lower leg, initial encounter: Secondary | ICD-10-CM | POA: Diagnosis not present

## 2015-11-17 DIAGNOSIS — Z79899 Other long term (current) drug therapy: Secondary | ICD-10-CM | POA: Diagnosis not present

## 2015-11-17 DIAGNOSIS — F17200 Nicotine dependence, unspecified, uncomplicated: Secondary | ICD-10-CM | POA: Diagnosis not present

## 2015-11-17 DIAGNOSIS — M25551 Pain in right hip: Secondary | ICD-10-CM | POA: Diagnosis not present

## 2015-11-17 DIAGNOSIS — W1830XA Fall on same level, unspecified, initial encounter: Secondary | ICD-10-CM | POA: Diagnosis not present

## 2015-11-17 DIAGNOSIS — I10 Essential (primary) hypertension: Secondary | ICD-10-CM | POA: Diagnosis not present

## 2015-11-17 DIAGNOSIS — R252 Cramp and spasm: Secondary | ICD-10-CM | POA: Diagnosis not present

## 2015-11-17 DIAGNOSIS — S22009S Unspecified fracture of unspecified thoracic vertebra, sequela: Secondary | ICD-10-CM | POA: Diagnosis not present

## 2015-11-17 DIAGNOSIS — G8311 Monoplegia of lower limb affecting right dominant side: Secondary | ICD-10-CM | POA: Diagnosis not present

## 2015-11-17 DIAGNOSIS — R52 Pain, unspecified: Secondary | ICD-10-CM | POA: Diagnosis not present

## 2015-11-18 ENCOUNTER — Telehealth: Payer: Self-pay | Admitting: Neurology

## 2015-11-18 MED ORDER — METHOCARBAMOL 500 MG PO TABS: 500.0000 mg | ORAL_TABLET | Freq: Four times a day (QID) | ORAL | Status: DC

## 2015-11-18 NOTE — Telephone Encounter (Signed)
I called patient. The patient fell about 4 days ago, he has injured his right hip, he indicates when he tries to bear weight the pain increases, and he will have a tendency to fall. The patient apparently has had x-rays of the back and the hip done, no fractures of the noted. I will put the patient on methocarbamol for the next 20 days, and initiate physical therapy. I will send him a prescription for physical therapy, I don't know what offices are around the area where he lives.
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
## 2015-11-18 NOTE — Telephone Encounter (Signed)
Robaxin e-scribed to pt's [facility]. Written rx for PT placed in mail.

## 2015-11-19 DIAGNOSIS — M549 Dorsalgia, unspecified: Secondary | ICD-10-CM | POA: Diagnosis not present

## 2015-11-19 DIAGNOSIS — J410 Simple chronic bronchitis: Secondary | ICD-10-CM | POA: Diagnosis not present

## 2015-11-19 DIAGNOSIS — I10 Essential (primary) hypertension: Secondary | ICD-10-CM | POA: Diagnosis not present

## 2015-11-19 DIAGNOSIS — M25551 Pain in right hip: Secondary | ICD-10-CM | POA: Diagnosis not present

## 2015-11-19 DIAGNOSIS — F17210 Nicotine dependence, cigarettes, uncomplicated: Secondary | ICD-10-CM | POA: Diagnosis not present

## 2015-11-19 DIAGNOSIS — M545 Low back pain: Secondary | ICD-10-CM | POA: Diagnosis not present

## 2015-11-19 DIAGNOSIS — S24102S Unspecified injury at T2-T6 level of thoracic spinal cord, sequela: Secondary | ICD-10-CM | POA: Diagnosis not present

## 2015-11-19 DIAGNOSIS — S3992XA Unspecified injury of lower back, initial encounter: Secondary | ICD-10-CM | POA: Diagnosis not present

## 2015-11-19 DIAGNOSIS — S3993XA Unspecified injury of pelvis, initial encounter: Secondary | ICD-10-CM | POA: Diagnosis not present

## 2015-11-19 DIAGNOSIS — G8220 Paraplegia, unspecified: Secondary | ICD-10-CM | POA: Diagnosis not present

## 2015-11-19 DIAGNOSIS — W1830XA Fall on same level, unspecified, initial encounter: Secondary | ICD-10-CM | POA: Diagnosis not present

## 2015-11-19 DIAGNOSIS — S7001XA Contusion of right hip, initial encounter: Secondary | ICD-10-CM | POA: Diagnosis not present

## 2015-11-19 DIAGNOSIS — W19XXXA Unspecified fall, initial encounter: Secondary | ICD-10-CM | POA: Diagnosis not present

## 2015-11-19 DIAGNOSIS — R102 Pelvic and perineal pain: Secondary | ICD-10-CM | POA: Diagnosis not present

## 2015-11-20 DIAGNOSIS — S3992XA Unspecified injury of lower back, initial encounter: Secondary | ICD-10-CM | POA: Diagnosis not present

## 2015-11-20 DIAGNOSIS — S3993XA Unspecified injury of pelvis, initial encounter: Secondary | ICD-10-CM | POA: Diagnosis not present

## 2015-11-20 DIAGNOSIS — R102 Pelvic and perineal pain: Secondary | ICD-10-CM | POA: Diagnosis not present

## 2015-11-20 DIAGNOSIS — M545 Low back pain: Secondary | ICD-10-CM | POA: Diagnosis not present

## 2015-11-21 DIAGNOSIS — R1084 Generalized abdominal pain: Secondary | ICD-10-CM | POA: Diagnosis not present

## 2015-11-21 DIAGNOSIS — M542 Cervicalgia: Secondary | ICD-10-CM | POA: Diagnosis not present

## 2015-11-21 DIAGNOSIS — Z981 Arthrodesis status: Secondary | ICD-10-CM | POA: Diagnosis not present

## 2015-11-21 DIAGNOSIS — Z5181 Encounter for therapeutic drug level monitoring: Secondary | ICD-10-CM | POA: Diagnosis not present

## 2015-11-21 DIAGNOSIS — M546 Pain in thoracic spine: Secondary | ICD-10-CM | POA: Diagnosis not present

## 2015-11-21 DIAGNOSIS — M545 Low back pain: Secondary | ICD-10-CM | POA: Diagnosis not present
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
## 2015-11-22 ENCOUNTER — Telehealth: Payer: Self-pay | Admitting: Neurology

## 2015-11-22 DIAGNOSIS — M546 Pain in thoracic spine: Secondary | ICD-10-CM | POA: Diagnosis not present

## 2015-11-22 DIAGNOSIS — I10 Essential (primary) hypertension: Secondary | ICD-10-CM | POA: Diagnosis not present

## 2015-11-22 DIAGNOSIS — M549 Dorsalgia, unspecified: Secondary | ICD-10-CM | POA: Diagnosis not present

## 2015-11-22 DIAGNOSIS — F17200 Nicotine dependence, unspecified, uncomplicated: Secondary | ICD-10-CM | POA: Diagnosis not present

## 2015-11-22 DIAGNOSIS — S24109A Unspecified injury at unspecified level of thoracic spinal cord, initial encounter: Secondary | ICD-10-CM | POA: Diagnosis not present

## 2015-11-22 DIAGNOSIS — R200 Anesthesia of skin: Secondary | ICD-10-CM | POA: Diagnosis not present

## 2015-11-22 DIAGNOSIS — G8220 Paraplegia, unspecified: Secondary | ICD-10-CM | POA: Diagnosis not present
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
## 2015-11-22 NOTE — Telephone Encounter (Signed)
Events noted

## 2015-11-25 ENCOUNTER — Encounter (HOSPITAL_COMMUNITY): Payer: Self-pay | Admitting: Emergency Medicine

## 2015-11-25 ENCOUNTER — Emergency Department (HOSPITAL_COMMUNITY)
Admission: EM | Admit: 2015-11-25 | Discharge: 2015-11-25 | Disposition: A | Discharge status: Home / Self Care | Payer: Medicare Other | Attending: Dermatology | Admitting: Dermatology

## 2015-11-25 ENCOUNTER — Encounter (HOSPITAL_COMMUNITY): Payer: Self-pay | Admitting: *Deleted

## 2015-11-25 ENCOUNTER — Emergency Department (HOSPITAL_COMMUNITY): Payer: Medicare Other

## 2015-11-25 ENCOUNTER — Emergency Department (HOSPITAL_COMMUNITY)
Admission: EM | Admit: 2015-11-25 | Discharge: 2015-11-26 | Disposition: A | Discharge status: Home / Self Care | Payer: Medicare Other | Attending: Emergency Medicine | Admitting: Emergency Medicine

## 2015-11-25 DIAGNOSIS — I10 Essential (primary) hypertension: Secondary | ICD-10-CM | POA: Insufficient documentation

## 2015-11-25 DIAGNOSIS — Y9389 Activity, other specified: Secondary | ICD-10-CM | POA: Insufficient documentation

## 2015-11-25 DIAGNOSIS — Y92410 Unspecified street and highway as the place of occurrence of the external cause: Secondary | ICD-10-CM | POA: Diagnosis not present

## 2015-11-25 DIAGNOSIS — S29019A Strain of muscle and tendon of unspecified wall of thorax, initial encounter: Secondary | ICD-10-CM | POA: Insufficient documentation

## 2015-11-25 DIAGNOSIS — F17210 Nicotine dependence, cigarettes, uncomplicated: Secondary | ICD-10-CM | POA: Insufficient documentation

## 2015-11-25 DIAGNOSIS — R52 Pain, unspecified: Secondary | ICD-10-CM

## 2015-11-25 DIAGNOSIS — Y999 Unspecified external cause status: Secondary | ICD-10-CM | POA: Diagnosis not present

## 2015-11-25 DIAGNOSIS — Z5321 Procedure and treatment not carried out due to patient leaving prior to being seen by health care provider: Secondary | ICD-10-CM | POA: Diagnosis not present

## 2015-11-25 DIAGNOSIS — Z79899 Other long term (current) drug therapy: Secondary | ICD-10-CM | POA: Diagnosis not present

## 2015-11-25 DIAGNOSIS — S29002A Unspecified injury of muscle and tendon of back wall of thorax, initial encounter: Secondary | ICD-10-CM | POA: Diagnosis not present

## 2015-11-25 DIAGNOSIS — M546 Pain in thoracic spine: Secondary | ICD-10-CM | POA: Diagnosis not present

## 2015-11-25 DIAGNOSIS — M549 Dorsalgia, unspecified: Secondary | ICD-10-CM | POA: Insufficient documentation

## 2015-11-25 DIAGNOSIS — M62830 Muscle spasm of back: Secondary | ICD-10-CM | POA: Insufficient documentation

## 2015-11-25 DIAGNOSIS — S299XXA Unspecified injury of thorax, initial encounter: Secondary | ICD-10-CM | POA: Diagnosis present

## 2015-11-25 MED ORDER — IBUPROFEN 400 MG PO TABS
800.0000 mg | ORAL_TABLET | Freq: Once | ORAL | Status: AC
  Administered 2015-11-25: 800 mg via ORAL
  Filled 2015-11-25: qty 2
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
## 2015-11-25 NOTE — Telephone Encounter (Signed)
Patient called in stating he is in route by ambulance to [facility] and is going to have MRI and other tests for his back.  He is in awful pain and wants us to know he will be here for his appointment tomorrow.
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
## 2015-11-25 NOTE — ED Notes (Signed)
Pt stating he would like to leave at this time.
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
## 2015-11-26 ENCOUNTER — Encounter: Payer: Self-pay | Admitting: Neurology

## 2015-11-26 ENCOUNTER — Ambulatory Visit (INDEPENDENT_AMBULATORY_CARE_PROVIDER_SITE_OTHER): Payer: Medicare Other | Admitting: Neurology

## 2015-11-26 VITALS — BP 130/90 | HR 64 | Ht 72.0 in | Wt 150.0 lb

## 2015-11-26 DIAGNOSIS — G894 Chronic pain syndrome: Secondary | ICD-10-CM | POA: Diagnosis not present

## 2015-11-26 DIAGNOSIS — M4714 Other spondylosis with myelopathy, thoracic region: Secondary | ICD-10-CM | POA: Diagnosis not present

## 2015-11-26 DIAGNOSIS — R269 Unspecified abnormalities of gait and mobility: Secondary | ICD-10-CM

## 2015-11-26 DIAGNOSIS — G8220 Paraplegia, unspecified: Secondary | ICD-10-CM

## 2015-11-26 DIAGNOSIS — F329 Major depressive disorder, single episode, unspecified: Secondary | ICD-10-CM

## 2015-11-26 DIAGNOSIS — F32A Depression, unspecified: Secondary | ICD-10-CM

## 2015-11-26 HISTORY — DX: Chronic pain syndrome: G89.4

## 2015-11-26 MED ORDER — OXYCODONE HCL 15 MG PO TABS: 15.0000 mg | ORAL_TABLET | Freq: Four times a day (QID) | ORAL | Status: DC | PRN

## 2015-11-26 MED ORDER — NAPROXEN 375 MG PO TABS: 375.0000 mg | ORAL_TABLET | Freq: Two times a day (BID) | ORAL | Status: DC

## 2015-11-26 MED ORDER — METHADONE HCL 10 MG PO TABS: 10.0000 mg | ORAL_TABLET | Freq: Three times a day (TID) | ORAL | Status: DC

## 2015-11-26 MED ORDER — CYCLOBENZAPRINE HCL 5 MG PO TABS: 5.0000 mg | ORAL_TABLET | Freq: Two times a day (BID) | ORAL | Status: DC | PRN
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
## 2015-12-05 ENCOUNTER — Telehealth: Payer: Self-pay | Admitting: *Deleted

## 2015-12-05 NOTE — Telephone Encounter (Signed)
Methocarbamol approved by [employer] ([phone]) - member ID#[id number] - valid through 05/17/16.

## 2015-12-23 ENCOUNTER — Other Ambulatory Visit: Payer: Self-pay | Admitting: Neurology

## 2015-12-23 MED ORDER — METHADONE HCL 10 MG PO TABS: 10.0000 mg | ORAL_TABLET | Freq: Three times a day (TID) | ORAL | 0 refills | Status: DC

## 2015-12-23 MED ORDER — OXYCODONE HCL 15 MG PO TABS: 15.0000 mg | ORAL_TABLET | Freq: Four times a day (QID) | ORAL | 0 refills | Status: DC | PRN

## 2015-12-23 NOTE — Telephone Encounter (Signed)
Last OV was 11/26/15 w/ new scripts given at that time. Follow-up appt scheduled 03/24/16.

## 2015-12-23 NOTE — Telephone Encounter (Signed)
Pt request refill for methadone (DOLOPHINE) 10 MG tablet and oxyCODONE (ROXICODONE) 15 MG immediate release tablet. Pt said his father will pick up RX tomorrow morning

## 2015-12-24 NOTE — Telephone Encounter (Signed)
Rx printed, signed, up front for pick-up. 

## 2016-01-17 ENCOUNTER — Ambulatory Visit: Payer: Medicare Other | Admitting: Neurology

## 2016-01-21 ENCOUNTER — Other Ambulatory Visit: Payer: Self-pay | Admitting: Neurology

## 2016-01-21 ENCOUNTER — Telehealth: Payer: Self-pay | Admitting: *Deleted

## 2016-01-21 MED ORDER — METHADONE HCL 10 MG PO TABS: 10.0000 mg | ORAL_TABLET | Freq: Three times a day (TID) | ORAL | 0 refills | Status: DC

## 2016-01-21 MED ORDER — METHOCARBAMOL 500 MG PO TABS: 500.0000 mg | ORAL_TABLET | Freq: Four times a day (QID) | ORAL | 0 refills | Status: DC

## 2016-01-21 MED ORDER — OXYCODONE HCL 15 MG PO TABS: 15.0000 mg | ORAL_TABLET | Freq: Four times a day (QID) | ORAL | 0 refills | Status: DC | PRN

## 2016-01-21 NOTE — Telephone Encounter (Signed)
Pt called to see if rx is ready. Please call and advise.

## 2016-01-21 NOTE — Telephone Encounter (Signed)
Prescription for refill ready for pick up at front desk.

## 2016-01-21 NOTE — Telephone Encounter (Signed)
Signed and up front for pick up

## 2016-01-21 NOTE — Telephone Encounter (Signed)
Rx was printed and signed per Dr. Marjory LiesPenumalli.

## 2016-01-21 NOTE — Telephone Encounter (Signed)
Methadone rx printed, awaiting signature.

## 2016-01-21 NOTE — Telephone Encounter (Signed)
Pt request refill for methocarbamol (ROBAXIN) 500 MG tablet and oxyCODONE (ROXICODONE) 15 MG immediate release tablet . Pt said he forgot about Monday being a holiday and wants to please pick up meds today. He will call back around 12 to check if RX has been written, he said he has a 4 hr drive to pick these up

## 2016-01-21 NOTE — Telephone Encounter (Signed)
Called pt to notify that RXs are available at front desk for pick-up.

## 2016-01-21 NOTE — Telephone Encounter (Signed)
Pt request refill for methadone (DOLOPHINE) 10 MG tablet and             Demographics    Jason Dominguez 35 year old male [birth year] [address]  [place] [id number] [phone] (H)   Advanced Directives   Encounter date 11/25/15 11/25/15 11/04/15  Last reading 10:08 PM 3:18 PM 4:03 AM  Does patient have an advance directive? No No No  Would patient like information on creating an advanced directive? 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  Bipolar 1 disorder (HCC)  Cigarette smoker  Bronchospasm with bronchitis, acute   Significant History/Details   Smoking: Current Every Day Smoker, 3 ppd, 36 pack-years  Smokeless Tobacco: Never Used  Alcohol: No  No open orders  Preferred Language: English   Oxygen Therapy: as of 07/22/11       SpO2 99 %   O2 Device None (Room air)     Specialty Comments Show AllReport  No comments regarding your specialty   Medications    baclofen (LIORESAL) 20 MG tablet    cyclobenzaprine (FLEXERIL) 5 MG tablet    diazepam (VALIUM) 10 MG tablet    methadone (DOLOPHINE) 10 MG tablet    methocarbamol (ROBAXIN) 500 MG tablet    naproxen (NAPROSYN) 375 MG tablet    oxyCODONE (ROXICODONE) 15 MG immediate release tablet    Mark as Reviewed Reviewed by MD on 11/26/2015   Preferred Pharmacies      [facility] [place], [place] - [address] [phone] (Phone) [phone] (Fax)  [facility] [address] [address] [place] - [address] [phone]  (Phone) [phone] (Fax)  [facility] - [place], [place] - [address] [phone] (Phone) [phone] (Fax)  [facility] - [place] - [address] [phone] (Phone) [phone] (Fax)   Recent Visits (Maximum of 10 visits)   Date Type Provider Description  12/05/2015 Telephone [name], [name], RN Methocarbamol PA  11/26/2015 Office Visit [name],[name], MD Depression (Primary Dx); Thoracic spondylosis with myelopat...  11/22/2015 Telephone [name],[name], MD Advice Only  11/18/2015 Telephone [name],[name], MD Advice Only (Right hip pain)  09/09/2015 Telephone [name],[name], MD Medication Refill (diazapem)  09/09/2015 Telephone [name],[name], MD Medication Refill  08/27/2015 Telephone [name],[name], MD Other (sick/pain)  08/15/2015 Telephone [name],[name], MD Advice Only (Referral )  08/13/2015 Telephone [name], [name], RN Medication Management  08/12/2015 Telephone [name], [name] Advice Only (Patient would [facility] a [facility] from [facility][name]..[name]  Flowsheet Report             Neurology Studies of Note (Last 3 results in 10 years)    07/02/11 1229 CT Head Wo Contrast View Image  Narrative: *RADIOLOGY REPORT*  Clinical Data: Unresponsive. Post CPR.  CT HEAD WITHOUT CONTRAST  (cont.)      My Last Outpatient Progress Note   There are no Outpatient notes of this type for this patient     Care Team and Communications   No referring provider set    PCPs Type  Provider Not In System General  No other patient care team members    Recipients of Past 7 Communications   Hospital Encounter - 11/25/2015    Jason Spaniel, MD 11/26/2015 In Basket  Provider Not In System 11/26/2015 In [facility] Encounter - 11/25/2015    Provider Not In System 11/25/2015 In [facility] Encounter - 11/04/2015    Provider Not In System 11/04/2015 In [facility] Encounter - 03/09/2013    Provider Not In System 03/09/2013 In [facility]  Office Visit - 11/25/2011    Jason Dominguez 11/25/2011 Mail  Hospital Encounter - 10/15/2011    Provider Not In System 10/15/2011 In [facility]

## 2016-01-21 NOTE — Addendum Note (Signed)
Addended by: Donnelly AngelicaHOGAN, JENNIFER L on: 01/21/2016 03:05 PM   Modules accepted: Orders

## 2016-01-21 NOTE — Addendum Note (Signed)
Addended by: Donnelly AngelicaHOGAN, JENNIFER L on: 01/21/2016 02:54 PM   Modules accepted: Orders

## 2016-01-21 NOTE — Telephone Encounter (Signed)
Pt was last seen by Dr. Anne HahnWillis in July and has a follow-up appt scheduled in November. Ref[facility] on [facility]. Oxy IR was last filled 12/23/15.

## 2016-01-21 NOTE — Telephone Encounter (Signed)
Pt called inquiring if RX's were ready. He was advised the request has been sent to w-in provider but it has not been authorized yet.  He will call back this afternoon.

## 2016-02-17 ENCOUNTER — Telehealth: Payer: Self-pay | Admitting: Neurology

## 2016-02-17 MED ORDER — METHADONE HCL 10 MG PO TABS: 10.0000 mg | ORAL_TABLET | Freq: Three times a day (TID) | ORAL | 0 refills | Status: DC

## 2016-02-17 MED ORDER — OXYCODONE HCL 15 MG PO TABS: 15.0000 mg | ORAL_TABLET | Freq: Four times a day (QID) | ORAL | 0 refills | Status: DC | PRN

## 2016-02-17 NOTE — Telephone Encounter (Signed)
Pt request refill for methadone (DOLOPHINE) 10 MG tablet and oxyCODONE (ROXICODONE) 15 MG immediate release tablet and diazepam (VALIUM) 10 MG tablet. Pt said he out of refills on diazepam

## 2016-02-17 NOTE — Telephone Encounter (Signed)
Rx printed, signed, up front for pick-up. 

## 2016-02-17 NOTE — Telephone Encounter (Signed)
Valium refill is not due until 03/10/16.

## 2016-02-17 NOTE — Telephone Encounter (Signed)
Last OV was in July. F/u scheduled in Nov. Last rx 01/21/16.

## 2016-02-18 ENCOUNTER — Telehealth: Payer: Self-pay | Admitting: *Deleted

## 2016-02-18 ENCOUNTER — Other Ambulatory Visit: Payer: Self-pay | Admitting: Neurology

## 2016-02-18 MED ORDER — DIAZEPAM 10 MG PO TABS: 10.0000 mg | ORAL_TABLET | Freq: Three times a day (TID) | ORAL | 5 refills | Status: DC

## 2016-02-18 NOTE — Progress Notes (Signed)
Diazepam prescription was refilled.

## 2016-02-18 NOTE — Telephone Encounter (Signed)
Received faxed request from [facility] this morning for refill on diazepam. Pt reported that he is out of medication. However, refills are not due for another 3 weeks. Last rx was written 09/09/15 w/ 5 refills.

## 2016-02-19 NOTE — Telephone Encounter (Signed)
Valium rx printed and signed by Dr. Anne HahnWillis, faxed in to pharmacy.

## 2016-02-21 NOTE — Telephone Encounter (Signed)
Valium rx was faxed to [facility] on 10/3. Called pharmacy to verify that they received faxed script. Said that rx was received, med filled and ready for pt to pick up. Spoke to pt who said that he "got it figured out." He would like all scripts to go to [facility] rather than [facility] which was removed from list of preferred pharmacies. Also states that he never started Robaxin so med taken off of current medication list. Voiced appreciation for call.

## 2016-02-21 NOTE — Telephone Encounter (Signed)
1) pt called said he does not understand why he can't have the diazepam (VALIUM) 10 MG tablet . He is wanting all meds to be called [facility], not [facility]. Was this RX sent to [facility]?  2) also he doesn't understand why methocarbamol (ROBAXIN) 500 MG tablet was called in.

## 2016-02-21 NOTE — Addendum Note (Signed)
Addended by: Donnelly AngelicaHOGAN, Kristofer Schaffert L on: 02/21/2016 12:45 PM   Modules accepted: Orders

## 2016-02-24 DIAGNOSIS — I10 Essential (primary) hypertension: Secondary | ICD-10-CM | POA: Diagnosis not present

## 2016-02-24 DIAGNOSIS — S6991XA Unspecified injury of right wrist, hand and finger(s), initial encounter: Secondary | ICD-10-CM | POA: Diagnosis not present

## 2016-02-24 DIAGNOSIS — W228XXA Striking against or struck by other objects, initial encounter: Secondary | ICD-10-CM | POA: Diagnosis not present

## 2016-02-24 DIAGNOSIS — S61111A Laceration without foreign body of right thumb with damage to nail, initial encounter: Secondary | ICD-10-CM | POA: Diagnosis not present

## 2016-02-24 DIAGNOSIS — S61214A Laceration without foreign body of right ring finger without damage to nail, initial encounter: Secondary | ICD-10-CM | POA: Diagnosis not present

## 2016-03-16 ENCOUNTER — Other Ambulatory Visit: Payer: Self-pay | Admitting: Neurology

## 2016-03-16 MED ORDER — METHADONE HCL 10 MG PO TABS: 10.0000 mg | ORAL_TABLET | Freq: Three times a day (TID) | ORAL | 0 refills | Status: DC

## 2016-03-16 MED ORDER — OXYCODONE HCL 15 MG PO TABS: 15.0000 mg | ORAL_TABLET | Freq: Four times a day (QID) | ORAL | 0 refills | Status: DC | PRN

## 2016-03-16 NOTE — Telephone Encounter (Signed)
Rx printed, signed, up front for pick-up. 

## 2016-03-16 NOTE — Telephone Encounter (Signed)
Last OV was in July w/ f/u scheduled in Dec. Most recent scripts written 02/17/16.

## 2016-03-16 NOTE — Telephone Encounter (Signed)
Patient requesting refill of oxyCODONE (ROXICODONE) 15 MG immediate release tablet , methadone (DOLOPHINE) 10 MG tablet °Pharmacy: pick up ° °

## 2016-03-23 ENCOUNTER — Ambulatory Visit: Payer: Medicare Other | Admitting: Neurology

## 2016-04-13 ENCOUNTER — Other Ambulatory Visit: Payer: Self-pay | Admitting: Neurology

## 2016-04-13 MED ORDER — OXYCODONE HCL 15 MG PO TABS: 15.0000 mg | ORAL_TABLET | Freq: Four times a day (QID) | ORAL | 0 refills | Status: DC | PRN

## 2016-04-13 MED ORDER — METHADONE HCL 10 MG PO TABS: 10.0000 mg | ORAL_TABLET | Freq: Three times a day (TID) | ORAL | 0 refills | Status: DC

## 2016-04-13 NOTE — Telephone Encounter (Signed)
Patient requesting refill of oxyCODONE (ROXICODONE) 15 MG immediate release tablet and methadone (DOLOPHINE) 10 MG tablet. I advised the Rx's will be ready in 24 hours unless the nurse advises otherwise.

## 2016-04-13 NOTE — Telephone Encounter (Signed)
Pt was last seen in July and has f/u scheduled in Feb. Last rxs written 03/16/16.

## 2016-04-13 NOTE — Telephone Encounter (Signed)
RXs printed, signed, up front for pick-up. 

## 2016-04-13 NOTE — Addendum Note (Signed)
Addended by: Donnelly AngelicaHOGAN, Casimer Russett L on: 04/13/2016 11:14 AM   Modules accepted: Orders

## 2016-04-20 ENCOUNTER — Ambulatory Visit: Payer: Self-pay | Admitting: Neurology

## 2016-05-06 ENCOUNTER — Telehealth: Payer: Self-pay | Admitting: *Deleted

## 2016-05-06 MED ORDER — OXYCODONE HCL 15 MG PO TABS: 15.0000 mg | ORAL_TABLET | Freq: Four times a day (QID) | ORAL | 0 refills | Status: DC | PRN

## 2016-05-06 MED ORDER — METHADONE HCL 10 MG PO TABS: 10.0000 mg | ORAL_TABLET | Freq: Three times a day (TID) | ORAL | 0 refills | Status: DC

## 2016-05-06 NOTE — Telephone Encounter (Signed)
Pt was seen for OV in July. He cancelled Nov and Dec appts and has r/s for Feb. Last scripts were written 04/13/16.

## 2016-05-06 NOTE — Telephone Encounter (Signed)
I will refill the methadone and oxycodone. 

## 2016-05-07 NOTE — Telephone Encounter (Signed)
I spoke to patient. He is aware that I have placed the Rxs at the front desk for p/u.

## 2016-06-08 ENCOUNTER — Other Ambulatory Visit: Payer: Self-pay | Admitting: Neurology

## 2016-06-08 MED ORDER — OXYCODONE HCL 15 MG PO TABS: 15.0000 mg | ORAL_TABLET | Freq: Four times a day (QID) | ORAL | 0 refills | Status: DC | PRN

## 2016-06-08 MED ORDER — METHADONE HCL 10 MG PO TABS: 10.0000 mg | ORAL_TABLET | Freq: Three times a day (TID) | ORAL | 0 refills | Status: DC

## 2016-06-08 NOTE — Telephone Encounter (Signed)
RXs printed, signed, up front for pick-up. 

## 2016-06-08 NOTE — Telephone Encounter (Signed)
Pt request refill for oxyCODONE (ROXICODONE) 15 MG immediate release tablet and methadone (DOLOPHINE) 10 MG tablet

## 2016-06-08 NOTE — Addendum Note (Signed)
Addended by: Donnelly AngelicaHOGAN, Fumiko Cham L on: 06/08/2016 02:44 PM   Modules accepted: Orders

## 2016-06-08 NOTE — Telephone Encounter (Signed)
Pt was seen for OV in July and has follow-up scheduled in Feb. Last rx was written 05/06/16.

## 2016-06-18 ENCOUNTER — Telehealth: Payer: Self-pay | Admitting: Neurology

## 2016-06-18 NOTE — Telephone Encounter (Signed)
Over the last several weeks, the patient has had 3 episodes of fecal incontinence which is unusual for him. The patient has very little warning that the bowel movement is coming.  The patient has been eating more vegetables and fruits trying to eat a healthy diet, may be getting more roughage in the diet. The patient indicates that he continues to do the Kegel exercises for the pelvic floor muscles. He is having increasing problems with fatigue and elevated pain since the cold weather has been present.  I will see him in office next week, we will get blood work at that time.

## 2016-06-18 NOTE — Telephone Encounter (Signed)
Patient has appointment 06-24-16 but wanted to discuss his overall health. The whole winter he has had no energy.and no control of his bladder or bowels. He would like a call back to discuss.

## 2016-06-24 ENCOUNTER — Ambulatory Visit (INDEPENDENT_AMBULATORY_CARE_PROVIDER_SITE_OTHER): Payer: Medicare Other | Admitting: Neurology

## 2016-06-24 ENCOUNTER — Encounter: Payer: Self-pay | Admitting: Neurology

## 2016-06-24 VITALS — BP 129/82 | HR 80 | Ht 72.0 in | Wt 161.0 lb

## 2016-06-24 DIAGNOSIS — G894 Chronic pain syndrome: Secondary | ICD-10-CM

## 2016-06-24 DIAGNOSIS — G8220 Paraplegia, unspecified: Secondary | ICD-10-CM

## 2016-06-24 DIAGNOSIS — M4714 Other spondylosis with myelopathy, thoracic region: Secondary | ICD-10-CM

## 2016-06-24 DIAGNOSIS — R269 Unspecified abnormalities of gait and mobility: Secondary | ICD-10-CM | POA: Diagnosis not present

## 2016-06-24 MED ORDER — CYCLOBENZAPRINE HCL 10 MG PO TABS: 10.0000 mg | ORAL_TABLET | Freq: Every day | ORAL | 1 refills | Status: DC

## 2016-06-24 NOTE — Patient Instructions (Signed)
   We will add flexeril at night for the leg spasms and to help the bowels.
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
## 2016-07-06 ENCOUNTER — Telehealth: Payer: Self-pay | Admitting: Neurology

## 2016-07-06 MED ORDER — METHADONE HCL 10 MG PO TABS: 10.0000 mg | ORAL_TABLET | Freq: Three times a day (TID) | ORAL | 0 refills | Status: DC

## 2016-07-06 MED ORDER — OXYCODONE HCL 15 MG PO TABS: 15.0000 mg | ORAL_TABLET | Freq: Four times a day (QID) | ORAL | 0 refills | Status: DC | PRN

## 2016-07-06 NOTE — Telephone Encounter (Signed)
Called and spoke to pt. He had set up f/u for 12/23/16 with Dr Anne HahnWillis. Advised rx's up front for pick up. He verbalized understanding.

## 2016-07-06 NOTE — Addendum Note (Signed)
Addended by: Stephanie AcreWILLIS, CHARLES on: 07/06/2016 01:09 PM   Modules accepted: Orders

## 2016-07-06 NOTE — Telephone Encounter (Signed)
Tried calling pt to set up f/u appt beginning of August. No f/u scheduled. VM not set up, unable to LVM.

## 2016-07-06 NOTE — Telephone Encounter (Signed)
Can set up a revisit when the patient comes in to pick up the prescription.

## 2016-07-06 NOTE — Telephone Encounter (Signed)
Pt returned RN's call, appt scheduled for 12/23/16

## 2016-07-06 NOTE — Telephone Encounter (Signed)
Patient requesting refill of oxyCODONE (ROXICODONE) 15 MG immediate release tablet and methadone (DOLOPHINE) 10 MG tablet.

## 2016-08-03 ENCOUNTER — Telehealth: Payer: Self-pay | Admitting: *Deleted

## 2016-08-03 ENCOUNTER — Telehealth: Payer: Self-pay | Admitting: Neurology

## 2016-08-03 MED ORDER — DIAZEPAM 10 MG PO TABS: 10.0000 mg | ORAL_TABLET | Freq: Three times a day (TID) | ORAL | 5 refills | Status: DC

## 2016-08-03 MED ORDER — METHADONE HCL 10 MG PO TABS: 10.0000 mg | ORAL_TABLET | Freq: Three times a day (TID) | ORAL | 0 refills | Status: DC

## 2016-08-03 MED ORDER — OXYCODONE HCL 15 MG PO TABS: 15.0000 mg | ORAL_TABLET | Freq: Four times a day (QID) | ORAL | 0 refills | Status: DC | PRN

## 2016-08-03 NOTE — Telephone Encounter (Signed)
I called in a prescription for diazepam.

## 2016-08-03 NOTE — Telephone Encounter (Signed)
Patient called office requesting refill for oxyCODONE (ROXICODONE) 15 MG immediate release tablet and methadone (DOLOPHINE) 10 MG tablet

## 2016-08-03 NOTE — Addendum Note (Signed)
Addended by: Stephanie AcreWILLIS, Tuan Tippin on: 08/03/2016 08:19 AM   Modules accepted: Orders

## 2016-08-03 NOTE — Telephone Encounter (Signed)
Called and spoke to pt. Advised both rx's ready for pick up at front desk. He verbalized understanding.

## 2016-08-03 NOTE — Telephone Encounter (Signed)
I will refill his opiate medications.

## 2016-08-03 NOTE — Telephone Encounter (Signed)
Faxed printed/signed rx diazepam to pt pharmacy. Fax: [phone]. Received confirmation.

## 2016-08-03 NOTE — Telephone Encounter (Signed)
Received rx refill request for valium. Rx last sent 02/18/16 qty 90, 5 refills.  Called [facility], spoke with [place]. She states pt all out of refills. He filled on 02/19/16, 03/17/16, 04/14/16, 05/12/16, 06/09/16, and 07/07/16.

## 2016-09-01 ENCOUNTER — Encounter (HOSPITAL_COMMUNITY): Payer: Self-pay

## 2016-09-01 ENCOUNTER — Other Ambulatory Visit: Payer: Self-pay | Admitting: Neurology

## 2016-09-01 ENCOUNTER — Emergency Department (HOSPITAL_COMMUNITY)
Admission: EM | Admit: 2016-09-01 | Discharge: 2016-09-01 | Disposition: A | Discharge status: Home / Self Care | Payer: Medicare Other | Attending: Emergency Medicine | Admitting: Emergency Medicine

## 2016-09-01 ENCOUNTER — Telehealth: Payer: Self-pay | Admitting: Neurology

## 2016-09-01 DIAGNOSIS — Z760 Encounter for issue of repeat prescription: Secondary | ICD-10-CM | POA: Diagnosis not present

## 2016-09-01 DIAGNOSIS — I10 Essential (primary) hypertension: Secondary | ICD-10-CM | POA: Diagnosis not present

## 2016-09-01 DIAGNOSIS — F17210 Nicotine dependence, cigarettes, uncomplicated: Secondary | ICD-10-CM | POA: Insufficient documentation

## 2016-09-01 MED ORDER — OXYCODONE HCL 15 MG PO TABS: 15.0000 mg | ORAL_TABLET | Freq: Four times a day (QID) | ORAL | 0 refills | Status: DC | PRN

## 2016-09-01 MED ORDER — METHADONE HCL 10 MG PO TABS: 10.0000 mg | ORAL_TABLET | Freq: Three times a day (TID) | ORAL | 0 refills | Status: DC

## 2016-09-01 NOTE — Discharge Planning (Signed)
Pt up for discharge. EDCM reviewed chart for possible CM needs.  No needs identified or communicated.  

## 2016-09-01 NOTE — Discharge Instructions (Signed)
As discussed, please follow up with Dr. Anne Hahn and establish care with a primary care provider for management of chronic pain.   We were able to reach management at [facility] and Dr. Anne Hahn will be contacting you.

## 2016-09-01 NOTE — Telephone Encounter (Signed)
Placed printed/signed rx's up front for patient pick up.  
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
## 2016-09-01 NOTE — Telephone Encounter (Signed)
   I called the patient. The Rx for oxycodone and methadone will be ready today.
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
## 2016-09-01 NOTE — ED Notes (Signed)
Dr. Cameron at bedside.

## 2016-09-22 ENCOUNTER — Telehealth: Payer: Self-pay | Admitting: Neurology

## 2016-09-22 NOTE — Telephone Encounter (Signed)
Pt called said he is having neurological issues with his body. He did not want to go into detail. Please call

## 2016-09-22 NOTE — Telephone Encounter (Signed)
I called patient. The patient did not take up, unable to leave a message. I'll call back later.

## 2016-09-22 NOTE — Telephone Encounter (Signed)
I called again, again unable to leave a message, I will call back tomorrow.

## 2016-09-23 NOTE — Telephone Encounter (Signed)
I called patient. Finally able to reach him. The patient claims that he is having increasing problems with fecal incontinence. The patient is having extreme urgency with bowel movements, and he will have incontinence before he can get to the bathroom. This has happened twice in the last week.  In general, the patient will have a bowel movement once every 3 days or so. He has not been eating properly, reducing the amount of food in his diet, he has noted that foods high in grease will produce stomach cramps and a bowel movement. He does not have a definite bowel regimen.  The patient will need to develop a regular bowel regimen once every 2 or 3 days where he uses suppositories and rectal stimulation to have a bowel movement. He will need to wear adult diapers when outside of the house.  Indicates that his bladder function has not changed. Function of the legs has not changed.  The patient has cut back on his muscle relaxant such as diazepam as he believes that this worsens the problem.  He is doing Cagle exercises on a regular basis.

## 2016-09-28 ENCOUNTER — Telehealth: Payer: Self-pay | Admitting: Neurology

## 2016-09-28 MED ORDER — OXYCODONE HCL 15 MG PO TABS: 15.0000 mg | ORAL_TABLET | Freq: Four times a day (QID) | ORAL | 0 refills | Status: DC | PRN

## 2016-09-28 MED ORDER — METHADONE HCL 10 MG PO TABS: 10.0000 mg | ORAL_TABLET | Freq: Three times a day (TID) | ORAL | 0 refills | Status: DC

## 2016-09-28 NOTE — Addendum Note (Signed)
Addended by: York SpanielWILLIS, Jode Lippe K on: 09/28/2016 01:20 PM   Modules accepted: Orders

## 2016-09-28 NOTE — Telephone Encounter (Signed)
Patient requesting refill of oxyCODONE (ROXICODONE) 15 MG immediate release tablet and methadone (DOLOPHINE) 10 MG tablet.

## 2016-09-28 NOTE — Telephone Encounter (Signed)
Oxycodone and methadone will be refilled.

## 2016-09-28 NOTE — Telephone Encounter (Signed)
Both Rx at front desk for pick up.

## 2016-10-26 ENCOUNTER — Telehealth: Payer: Self-pay | Admitting: Neurology

## 2016-10-26 MED ORDER — METHADONE HCL 10 MG PO TABS: 10.0000 mg | ORAL_TABLET | Freq: Three times a day (TID) | ORAL | 0 refills | Status: DC

## 2016-10-26 MED ORDER — OXYCODONE HCL 15 MG PO TABS: 15.0000 mg | ORAL_TABLET | Freq: Four times a day (QID) | ORAL | 0 refills | Status: DC | PRN

## 2016-10-26 NOTE — Telephone Encounter (Signed)
Oxycodone and methadone will be refilled.

## 2016-10-26 NOTE — Telephone Encounter (Signed)
Placed printed/signed rx oxycodone and methadone up front for patient pick up.

## 2016-10-26 NOTE — Telephone Encounter (Signed)
Patient called office requesting refills for oxyCODONE (ROXICODONE) 15 MG immediate release tablet and methadone (DOLOPHINE) 10 MG tablet.

## 2016-10-26 NOTE — Addendum Note (Signed)
Addended by: York SpanielWILLIS, Chester Sibert K on: 10/26/2016 09:27 AM   Modules accepted: Orders

## 2016-11-23 ENCOUNTER — Telehealth: Payer: Self-pay | Admitting: Neurology

## 2016-11-23 MED ORDER — OXYCODONE HCL 15 MG PO TABS: 15.0000 mg | ORAL_TABLET | Freq: Four times a day (QID) | ORAL | 0 refills | Status: DC | PRN

## 2016-11-23 MED ORDER — METHADONE HCL 10 MG PO TABS: 10.0000 mg | ORAL_TABLET | Freq: Three times a day (TID) | ORAL | 0 refills | Status: DC

## 2016-11-23 NOTE — Addendum Note (Signed)
Addended by: York SpanielWILLIS, Zasha Belleau K on: 11/23/2016 04:56 PM   Modules accepted: Orders

## 2016-11-23 NOTE — Telephone Encounter (Signed)
A prescription for the methadone and oxycodone will be refilled.

## 2016-11-23 NOTE — Telephone Encounter (Signed)
Patient called office requesting refills for oxyCODONE (ROXICODONE) 15 MG immediate release tablet, and methadone (DOLOPHINE) 10 MG tablet.

## 2016-11-24 NOTE — Telephone Encounter (Signed)
Placed printed/signed rx's up front for patient pick up.  

## 2016-12-01 ENCOUNTER — Telehealth: Payer: Self-pay | Admitting: Neurology

## 2016-12-01 NOTE — Telephone Encounter (Signed)
I called the patient. The patient is feeling more depressed, he does not feel that he wants to commit suicide.  He feels that it is too much effort to do anything that requires physical activity. Last summer when he was engaged to be married, he was going to church on a regular basis and playing in a band, he was quite active. Now that he is depressed, he cannot do anything.  I have recommended that he seek out a good counselor or psychologist, I have also recommended that he seek out a support group with other individuals with paraplegia or paraparesis. He needs to do what he has to do to improve his mental outlook.  Part of the problem is that being depressed is inate in his personality, he had issues with depression well before the spinal cord injury occurred.

## 2016-12-01 NOTE — Telephone Encounter (Signed)
I tried to call the patient. Unable to leave a message, I will call back later.

## 2016-12-01 NOTE — Telephone Encounter (Signed)
Patient called office in reference to his rapidly declining has no energy, constantly in pain, no stamina.  Patient states it is getting to the point no longer able to take care of himself and feeling like giving up (not harming himself) and doesn't know what to do anymore.  Patient was upset during call.  Please call

## 2016-12-21 ENCOUNTER — Telehealth: Payer: Self-pay | Admitting: Neurology

## 2016-12-21 MED ORDER — OXYCODONE HCL 15 MG PO TABS: 15.0000 mg | ORAL_TABLET | Freq: Four times a day (QID) | ORAL | 0 refills | Status: DC | PRN

## 2016-12-21 MED ORDER — METHADONE HCL 10 MG PO TABS: 10.0000 mg | ORAL_TABLET | Freq: Three times a day (TID) | ORAL | 0 refills | Status: DC

## 2016-12-21 NOTE — Telephone Encounter (Signed)
Placed printed/signed rx's up front for patient pick up.  

## 2016-12-21 NOTE — Telephone Encounter (Signed)
The methadone and oxycodone were refilled. 

## 2016-12-21 NOTE — Addendum Note (Signed)
Addended by: York SpanielWILLIS, Marvel Mcphillips K on: 12/21/2016 10:17 AM   Modules accepted: Orders

## 2016-12-21 NOTE — Telephone Encounter (Signed)
Pt calling for refill of   methadone (DOLOPHINE) 10 MG tablet  oxyCODONE (ROXICODONE) 15 MG immediate release tablet

## 2016-12-21 NOTE — Telephone Encounter (Signed)
The [facility]  registry was checked.

## 2016-12-23 ENCOUNTER — Ambulatory Visit: Payer: Medicare Other | Admitting: Neurology

## 2017-01-15 ENCOUNTER — Telehealth: Payer: Self-pay | Admitting: Neurology

## 2017-01-15 NOTE — Telephone Encounter (Signed)
Tried calling patient. Unable to reach.  Sent to Dr Anne HahnWillis to refill when he is back. Patient last received rx 12/21/16

## 2017-01-15 NOTE — Telephone Encounter (Signed)
Pt calling for refill of oxyCODONE (ROXICODONE) 15 MG immediate release tablet & methadone (DOLOPHINE) 10 MG tablet

## 2017-01-19 ENCOUNTER — Telehealth: Payer: Self-pay | Admitting: Neurology

## 2017-01-19 MED ORDER — DIAZEPAM 10 MG PO TABS
10.0000 mg | ORAL_TABLET | Freq: Three times a day (TID) | ORAL | 5 refills | Status: DC
Start: 2017-01-19 — End: 2017-07-08

## 2017-01-19 MED ORDER — OXYCODONE HCL 15 MG PO TABS: 15.0000 mg | ORAL_TABLET | Freq: Four times a day (QID) | ORAL | 0 refills | Status: DC | PRN

## 2017-01-19 MED ORDER — METHADONE HCL 10 MG PO TABS: 10.0000 mg | ORAL_TABLET | Freq: Three times a day (TID) | ORAL | 0 refills | Status: DC

## 2017-01-19 NOTE — Telephone Encounter (Signed)
The prescription for methadone and oxycodone were refilled.

## 2017-01-19 NOTE — Addendum Note (Signed)
Addended by: York SpanielWILLIS, Dontrez Pettis K on: 01/19/2017 05:38 PM   Modules accepted: Orders

## 2017-01-19 NOTE — Telephone Encounter (Signed)
Prescription for diazepam was given.

## 2017-01-19 NOTE — Addendum Note (Signed)
Addended by: York SpanielWILLIS, CHARLES K on: 01/19/2017 07:55 AM   Modules accepted: Orders

## 2017-01-19 NOTE — Telephone Encounter (Signed)
Placed printed/signed rx's up front for patient pick up.  

## 2017-01-19 NOTE — Telephone Encounter (Signed)
Pt request refill for diazepam (VALIUM) 10 MG tablet sent to [facility]

## 2017-01-20 NOTE — Telephone Encounter (Signed)
Faxed printed/signed rx valium to pt pharmacy [facility]). Fax: [phone]. Received confirmation.

## 2017-02-09 ENCOUNTER — Ambulatory Visit: Payer: Medicare Other | Admitting: Neurology

## 2017-02-10 DIAGNOSIS — M25571 Pain in right ankle and joints of right foot: Secondary | ICD-10-CM | POA: Diagnosis not present

## 2017-02-10 DIAGNOSIS — S9032XA Contusion of left foot, initial encounter: Secondary | ICD-10-CM | POA: Diagnosis not present

## 2017-02-10 DIAGNOSIS — I10 Essential (primary) hypertension: Secondary | ICD-10-CM | POA: Diagnosis not present

## 2017-02-10 DIAGNOSIS — S9002XA Contusion of left ankle, initial encounter: Secondary | ICD-10-CM | POA: Diagnosis not present

## 2017-02-10 DIAGNOSIS — F17200 Nicotine dependence, unspecified, uncomplicated: Secondary | ICD-10-CM | POA: Diagnosis not present

## 2017-02-10 DIAGNOSIS — M79671 Pain in right foot: Secondary | ICD-10-CM | POA: Diagnosis not present

## 2017-02-15 ENCOUNTER — Telehealth: Payer: Self-pay | Admitting: Neurology

## 2017-02-15 NOTE — Telephone Encounter (Signed)
The prescription for oxycodone and methadone are due on 02/16/2017, I will write the prescription then.

## 2017-02-15 NOTE — Telephone Encounter (Signed)
Pt request refill for methadone (DOLOPHINE) 10 MG tablet and oxyCODONE (ROXICODONE) 15 MG immediate release tablet. Pt's father will pick it up on Wednesday

## 2017-02-16 MED ORDER — METHADONE HCL 10 MG PO TABS: 10.0000 mg | ORAL_TABLET | Freq: Three times a day (TID) | ORAL | 0 refills | Status: DC

## 2017-02-16 MED ORDER — OXYCODONE HCL 15 MG PO TABS: 15.0000 mg | ORAL_TABLET | Freq: Four times a day (QID) | ORAL | 0 refills | Status: DC | PRN

## 2017-02-16 NOTE — Telephone Encounter (Signed)
Rx for Oxycodone at the front for patient to pick up.

## 2017-03-01 NOTE — Telephone Encounter (Signed)
Close Encounter 

## 2017-03-15 ENCOUNTER — Telehealth: Payer: Self-pay | Admitting: Neurology

## 2017-03-15 NOTE — Telephone Encounter (Signed)
Pt request refill for oxyCODONE (ROXICODONE) 15 MG immediate release tablet and methadone (DOLOPHINE) 10 MG tablet . Pt said his father will pick it up on Wednesday

## 2017-03-16 MED ORDER — OXYCODONE HCL 15 MG PO TABS: 15.0000 mg | ORAL_TABLET | Freq: Four times a day (QID) | ORAL | 0 refills | Status: DC | PRN

## 2017-03-16 MED ORDER — METHADONE HCL 10 MG PO TABS: 10.0000 mg | ORAL_TABLET | Freq: Three times a day (TID) | ORAL | 0 refills | Status: DC

## 2017-03-16 NOTE — Telephone Encounter (Signed)
Placed printed/signed rx's up front for patient pick up.  

## 2017-03-16 NOTE — Telephone Encounter (Signed)
The prescription for oxycodone and for methadone will be refilled today.

## 2017-04-13 ENCOUNTER — Telehealth: Payer: Self-pay | Admitting: Neurology

## 2017-04-13 MED ORDER — METHADONE HCL 10 MG PO TABS: 10.0000 mg | ORAL_TABLET | Freq: Three times a day (TID) | ORAL | 0 refills | Status: DC

## 2017-04-13 MED ORDER — OXYCODONE HCL 15 MG PO TABS: 15.0000 mg | ORAL_TABLET | Freq: Four times a day (QID) | ORAL | 0 refills | Status: DC | PRN

## 2017-04-13 NOTE — Telephone Encounter (Signed)
The prescription for methadone and oxycodone will be refilled.

## 2017-04-13 NOTE — Addendum Note (Signed)
Addended by: York SpanielWILLIS, Smita Lesh K on: 04/13/2017 05:19 PM   Modules accepted: Orders

## 2017-04-13 NOTE — Telephone Encounter (Signed)
Pt has called for a refill prescription for oxyCODONE (ROXICODONE) 15 MG immediate release tablet And  methadone (DOLOPHINE) 10 MG tablet

## 2017-04-14 NOTE — Telephone Encounter (Signed)
Placed printed/signed rx's up front for patient pick up.  

## 2017-05-04 ENCOUNTER — Telehealth: Payer: Self-pay | Admitting: Neurology

## 2017-05-04 NOTE — Telephone Encounter (Addendum)
Pt request refill for oxyCODONE (ROXICODONE) 15 MG immediate release tablet and methadone (DOLOPHINE) 10 MG tablet . Pt is requesting to be called when RX is ready to be picked up. Pt is aware the clinic is closed on 12/24 and 12/25.

## 2017-05-04 NOTE — Telephone Encounter (Signed)
The [facility] registry was checked.  The prescriptions are not due until 11 May 2017, we are open on 26 December, I will write the prescriptions then.

## 2017-05-06 NOTE — Telephone Encounter (Signed)
Advised patient of previous message. He will pick up Rx's on 05-12-17.

## 2017-05-12 MED ORDER — OXYCODONE HCL 15 MG PO TABS: 15.0000 mg | ORAL_TABLET | Freq: Four times a day (QID) | ORAL | 0 refills | Status: DC | PRN

## 2017-05-12 MED ORDER — METHADONE HCL 10 MG PO TABS: 10.0000 mg | ORAL_TABLET | Freq: Three times a day (TID) | ORAL | 0 refills | Status: DC

## 2017-05-12 NOTE — Addendum Note (Signed)
Addended by: York SpanielWILLIS, Weslyn Holsonback K on: 05/12/2017 07:25 AM   Modules accepted: Orders

## 2017-06-08 ENCOUNTER — Telehealth: Payer: Self-pay | Admitting: Neurology

## 2017-06-08 NOTE — Telephone Encounter (Signed)
Patient requesting refill of  oxyCODONE (ROXICODONE) 15 MG immediate release tablet and methadone (DOLOPHINE) 10 MG tablet.

## 2017-06-08 NOTE — Telephone Encounter (Signed)
Prescriptions for Oxycodone and methadone are due 06/09/17.

## 2017-06-09 MED ORDER — METHADONE HCL 10 MG PO TABS: 10.0000 mg | ORAL_TABLET | Freq: Three times a day (TID) | ORAL | 0 refills | Status: DC

## 2017-06-09 MED ORDER — OXYCODONE HCL 15 MG PO TABS: 15.0000 mg | ORAL_TABLET | Freq: Four times a day (QID) | ORAL | 0 refills | Status: DC | PRN

## 2017-06-09 NOTE — Addendum Note (Signed)
Addended by: Lyrah Bradt K on: 06/09/2017 07:26 AM   Modules accepted: Orders  

## 2017-06-09 NOTE — Telephone Encounter (Signed)
Placed printed/signed rx's up front for patient pick up.  

## 2017-06-25 ENCOUNTER — Ambulatory Visit: Payer: Medicare Other | Admitting: Neurology

## 2017-07-06 ENCOUNTER — Telehealth: Payer: Self-pay | Admitting: Neurology

## 2017-07-06 NOTE — Telephone Encounter (Signed)
Pt requesting a refill for oxyCODONE (ROXICODONE) 15 MG immediate release tablet and methadone (DOLOPHINE) 10 MG tablet

## 2017-07-06 NOTE — Telephone Encounter (Signed)
The [facility] registry was checked, the medication is due on 07 July 2017.

## 2017-07-07 MED ORDER — OXYCODONE HCL 15 MG PO TABS: 15.0000 mg | ORAL_TABLET | Freq: Four times a day (QID) | ORAL | 0 refills | Status: DC | PRN

## 2017-07-07 MED ORDER — METHADONE HCL 10 MG PO TABS: 10.0000 mg | ORAL_TABLET | Freq: Three times a day (TID) | ORAL | 0 refills | Status: DC

## 2017-07-07 NOTE — Addendum Note (Signed)
Addended by: York SpanielWILLIS, Rayden Dock K on: 07/07/2017 07:40 AM   Modules accepted: Orders

## 2017-07-07 NOTE — Telephone Encounter (Signed)
Placed printed/signed rx's up front for patient pick up.  

## 2017-07-08 ENCOUNTER — Encounter: Payer: Self-pay | Admitting: Neurology

## 2017-07-08 ENCOUNTER — Ambulatory Visit (INDEPENDENT_AMBULATORY_CARE_PROVIDER_SITE_OTHER): Payer: Medicare Other | Admitting: Neurology

## 2017-07-08 ENCOUNTER — Telehealth: Payer: Self-pay | Admitting: Neurology

## 2017-07-08 VITALS — BP 127/84 | HR 76

## 2017-07-08 DIAGNOSIS — G8220 Paraplegia, unspecified: Secondary | ICD-10-CM | POA: Diagnosis not present

## 2017-07-08 DIAGNOSIS — G894 Chronic pain syndrome: Secondary | ICD-10-CM | POA: Diagnosis not present

## 2017-07-08 DIAGNOSIS — R269 Unspecified abnormalities of gait and mobility: Secondary | ICD-10-CM

## 2017-07-08 DIAGNOSIS — M21371 Foot drop, right foot: Secondary | ICD-10-CM | POA: Diagnosis not present

## 2017-07-08 DIAGNOSIS — Z79891 Long term (current) use of opiate analgesic: Secondary | ICD-10-CM

## 2017-07-08 MED ORDER — DIAZEPAM 10 MG PO TABS: 10.0000 mg | ORAL_TABLET | Freq: Three times a day (TID) | ORAL | 5 refills | Status: DC

## 2017-07-08 NOTE — Telephone Encounter (Signed)
I called the patient.  Unable to leave a message, I will try to call back later.
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
## 2017-07-08 NOTE — Telephone Encounter (Signed)
Pt is requesting a call from Dr Anne HahnWillis to discuss some things he forgot to ask at the OV today. Please call to advise

## 2017-07-09 ENCOUNTER — Telehealth: Payer: Self-pay

## 2017-07-09 NOTE — Telephone Encounter (Signed)
Completed PA for diazepam, [facility], should have a determination in 3-5 business days.

## 2017-07-12 NOTE — Telephone Encounter (Signed)
Received [facility] notification from [facility] that no PA needed for rx diazepam for 2019 plan year.

## 2017-07-16 LAB — THC,MS,WB/SP RFX
CANNABINOID CONFIRMATION: POSITIVE
CANNABINOL: NEGATIVE ng/mL
CARBOXY-THC: 93.2 ng/mL
Cannabidiol: NEGATIVE ng/mL
HYDROXY-THC: 1.6 ng/mL
TETRAHYDROCANNABINOL(THC): 2.1 ng/mL

## 2017-07-16 LAB — DRUG SCREEN 10 W/CONF, SERUM
AMPHETAMINES, IA: NEGATIVE ng/mL
Barbiturates, IA: NEGATIVE ug/mL
Benzodiazepines, IA: POSITIVE ng/mL — AB
Cocaine & Metabolite, IA: NEGATIVE ng/mL
METHADONE, IA: POSITIVE ng/mL — AB
OPIATES, IA: NEGATIVE ng/mL
OXYCODONES, IA: POSITIVE ng/mL — AB
PROPOXYPHENE, IA: NEGATIVE ng/mL
Phencyclidine, IA: NEGATIVE ng/mL
THC(Marijuana) Metabolite, IA: POSITIVE ng/mL — AB

## 2017-07-16 LAB — BENZODIAZEPINES,MS,WB/SP RFX
7-Aminoclonazepam: NEGATIVE ng/mL
ALPRAZOLAM: NEGATIVE ng/mL
BENZODIAZEPINES CONFIRM: POSITIVE
Chlordiazepoxide: NEGATIVE ng/mL
Clonazepam: NEGATIVE ng/mL
DESMETHYLCHLORDIAZEPOXIDE: NEGATIVE ng/mL
DESMETHYLDIAZEPAM: 682 ng/mL
Desalkylflurazepam: NEGATIVE ng/mL
Diazepam: 529 ng/mL
FLURAZEPAM: NEGATIVE ng/mL
LORAZEPAM: NEGATIVE ng/mL
Midazolam: NEGATIVE ng/mL
Oxazepam: 54 ng/mL
TEMAZEPAM: 31 ng/mL
Triazolam: NEGATIVE ng/mL

## 2017-07-16 LAB — OPIATES,MS,WB/SP RFX
6-ACETYLMORPHINE: NEGATIVE
Codeine: NEGATIVE ng/mL
Dihydrocodeine: NEGATIVE ng/mL
HYDROCODONE: NEGATIVE ng/mL
Hydromorphone: NEGATIVE ng/mL
Morphine: NEGATIVE ng/mL
OPIATE CONFIRMATION: NEGATIVE

## 2017-07-16 LAB — OXYCODONES,MS,WB/SP RFX
OXYCODONES CONFIRMATION: POSITIVE
Oxycocone: 202.8 ng/mL
Oxymorphone: 1.5 ng/mL

## 2017-07-16 LAB — METHADONE,MS,WB/SP RFX
METHADONE CONFIRMATION: POSITIVE
Methadone: 144.2 ng/mL

## 2017-08-03 ENCOUNTER — Other Ambulatory Visit: Payer: Self-pay | Admitting: Neurology

## 2017-08-03 NOTE — Telephone Encounter (Signed)
Pt requesting a refill for oxyCODONE (ROXICODONE) 15 MG immediate release tablet and methadone (DOLOPHINE) 10 MG tablet sent to [facility]. (pt aware Dr. Anne HahnWillis is now able to send RX directly to pharmacy and will not need to pick up a printed RX)

## 2017-08-04 MED ORDER — METHADONE HCL 10 MG PO TABS: 10.0000 mg | ORAL_TABLET | Freq: Three times a day (TID) | ORAL | 0 refills | Status: DC

## 2017-08-04 MED ORDER — OXYCODONE HCL 15 MG PO TABS: 15.0000 mg | ORAL_TABLET | Freq: Four times a day (QID) | ORAL | 0 refills | Status: DC | PRN

## 2017-09-01 ENCOUNTER — Telehealth: Payer: Self-pay | Admitting: Neurology

## 2017-09-01 MED ORDER — METHADONE HCL 10 MG PO TABS: 10.0000 mg | ORAL_TABLET | Freq: Three times a day (TID) | ORAL | 0 refills | Status: DC

## 2017-09-01 MED ORDER — OXYCODONE HCL 15 MG PO TABS: 15.0000 mg | ORAL_TABLET | Freq: Four times a day (QID) | ORAL | 0 refills | Status: DC | PRN

## 2017-09-01 NOTE — Telephone Encounter (Signed)
The oxycodone and methadone were refilled, the [place] registry was checked. 

## 2017-09-01 NOTE — Telephone Encounter (Signed)
Patient requesting refill of oxyCODONE (ROXICODONE) 15 MG immediate release tablet and methadone (DOLOPHINE) 10 MG tablet called to [facility]. °

## 2017-09-01 NOTE — Addendum Note (Signed)
Addended by: York SpanielWILLIS, CHARLES K on: 09/01/2017 06:27 PM   Modules accepted: Orders

## 2017-09-29 ENCOUNTER — Other Ambulatory Visit: Payer: Self-pay | Admitting: Neurology

## 2017-09-29 MED ORDER — METHADONE HCL 10 MG PO TABS: 10.0000 mg | ORAL_TABLET | Freq: Three times a day (TID) | ORAL | 0 refills | Status: DC

## 2017-09-29 MED ORDER — OXYCODONE HCL 15 MG PO TABS: 15.0000 mg | ORAL_TABLET | Freq: Four times a day (QID) | ORAL | 0 refills | Status: DC | PRN

## 2017-09-29 NOTE — Telephone Encounter (Signed)
The oxycodone and methadone prescriptions will be refilled, the [facility] registry was checked.

## 2017-09-29 NOTE — Telephone Encounter (Signed)
Took call from phone staff. Spoke w/ Judeth Cornfield from [facility]. She wanted dx code for methadone, oxycodone. Advised dx: G89.4. Wanted to make sure Dr. Anne Hahn aware he is also on diazepam and has black box warning. Advised Dr. Anne Hahn aware. He is prescribing these medications for the patient. She verbalized understanding and appreciation.

## 2017-09-29 NOTE — Telephone Encounter (Signed)
Pt request refill for oxyCODONE (ROXICODONE) 15 MG immediate release tablet and oxyCODONE (ROXICODONE) 15 MG immediate release tablet sent to [facility]

## 2017-10-28 ENCOUNTER — Other Ambulatory Visit: Payer: Self-pay | Admitting: Neurology

## 2017-10-28 MED ORDER — METHADONE HCL 10 MG PO TABS: 10.0000 mg | ORAL_TABLET | Freq: Three times a day (TID) | ORAL | 0 refills | Status: DC

## 2017-10-28 MED ORDER — OXYCODONE HCL 15 MG PO TABS: 15.0000 mg | ORAL_TABLET | Freq: Four times a day (QID) | ORAL | 0 refills | Status: DC | PRN

## 2017-10-28 NOTE — Telephone Encounter (Signed)
Patient requesting refill of oxyCODONE (ROXICODONE) 15 MG immediate release tablet and methadone (DOLOPHINE) 10 MG tablet called to [facility]. °

## 2017-10-29 ENCOUNTER — Telehealth: Payer: Self-pay

## 2017-10-29 NOTE — Telephone Encounter (Signed)
Received a PA request from [facility] for pt's methadone. PA completed[facility]. Key: [id number]: [id number].

## 2017-10-29 NOTE — Telephone Encounter (Signed)
PA has been approved by [facility], informed pharmacy of this approval. Approval good from 05/16/17-05/17/2018.

## 2017-11-23 ENCOUNTER — Other Ambulatory Visit: Payer: Self-pay | Admitting: Neurology

## 2017-11-23 MED ORDER — METHADONE HCL 10 MG PO TABS: 10.0000 mg | ORAL_TABLET | Freq: Three times a day (TID) | ORAL | 0 refills | Status: DC

## 2017-11-23 MED ORDER — OXYCODONE HCL 15 MG PO TABS: 15.0000 mg | ORAL_TABLET | Freq: Four times a day (QID) | ORAL | 0 refills | Status: DC | PRN

## 2017-11-23 NOTE — Telephone Encounter (Signed)
Patient requesting refill of oxyCODONE (ROXICODONE) 15 MG immediate release tablet and methadone (DOLOPHINE) 10 MG tablet [facility] to [facility]. I advised Dr. Anne HahnWillis is out of the office and will send to his nurse.

## 2017-11-23 NOTE — Addendum Note (Signed)
Addended by: Hillis RangeKING, Bob Eastwood L on: 11/23/2017 01:48 PM   Modules accepted: Orders

## 2017-12-27 ENCOUNTER — Other Ambulatory Visit: Payer: Self-pay | Admitting: Neurology

## 2017-12-27 MED ORDER — OXYCODONE HCL 15 MG PO TABS: 15.0000 mg | ORAL_TABLET | Freq: Four times a day (QID) | ORAL | 0 refills | Status: DC | PRN

## 2017-12-27 MED ORDER — METHADONE HCL 10 MG PO TABS: 10.0000 mg | ORAL_TABLET | Freq: Three times a day (TID) | ORAL | 0 refills | Status: DC

## 2017-12-27 NOTE — Telephone Encounter (Signed)
Pt request refill for oxyCODONE (ROXICODONE) 15 MG immediate release tablet and methadone (DOLOPHINE) 10 MG tablet sent to [facility]

## 2017-12-27 NOTE — Telephone Encounter (Signed)
Rx registry checked. Last fill date was 11/27/17 for #90.

## 2017-12-27 NOTE — Telephone Encounter (Signed)
Rx registry checked. Last fill date is 11/27/17 for #90. Next OV is 01/13/18.

## 2018-01-13 ENCOUNTER — Ambulatory Visit: Payer: Self-pay | Admitting: Nurse Practitioner

## 2018-01-24 ENCOUNTER — Other Ambulatory Visit: Payer: Self-pay | Admitting: Neurology

## 2018-01-24 MED ORDER — OXYCODONE HCL 15 MG PO TABS: 15.0000 mg | ORAL_TABLET | Freq: Four times a day (QID) | ORAL | 0 refills | Status: DC | PRN

## 2018-01-24 NOTE — Telephone Encounter (Signed)
Patient requesting refill of oxyCODONE (ROXICODONE) 15 MG immediate release tablet and methadone (DOLOPHINE) 10 MG tablet called to [facility].

## 2018-01-25 MED ORDER — METHADONE HCL 10 MG PO TABS: 10.0000 mg | ORAL_TABLET | Freq: Three times a day (TID) | ORAL | 0 refills | Status: DC

## 2018-01-25 NOTE — Addendum Note (Signed)
Addended by: Eilene Ghazi L on: 01/25/2018 10:12 AM   Modules accepted: Orders

## 2018-01-25 NOTE — Telephone Encounter (Signed)
Patient states [facility] does not have refill for methadone (DOLOPHINE) 10 MG tablet.

## 2018-02-08 ENCOUNTER — Ambulatory Visit: Payer: Medicare Other | Admitting: Nurse Practitioner

## 2018-02-21 ENCOUNTER — Telehealth: Payer: Self-pay | Admitting: Neurology

## 2018-02-21 NOTE — Telephone Encounter (Signed)
Patient's last appt was on 07/08/17.  His next appt is scheduled on 05/25/2018.  No issues noted on his [place] narcotic registry.  His last refills of oxycodone and methadone were both on 01/25/18.

## 2018-02-21 NOTE — Telephone Encounter (Signed)
Pt request refills for oxyCODONE (ROXICODONE) 15 MG immediate release tablet and methadone (DOLOPHINE) 10 MG tablet sent to [facility]

## 2018-02-22 MED ORDER — OXYCODONE HCL 15 MG PO TABS: 15.0000 mg | ORAL_TABLET | Freq: Four times a day (QID) | ORAL | 0 refills | Status: DC | PRN

## 2018-02-22 MED ORDER — METHADONE HCL 10 MG PO TABS: 10.0000 mg | ORAL_TABLET | Freq: Three times a day (TID) | ORAL | 0 refills | Status: DC

## 2018-02-22 NOTE — Telephone Encounter (Signed)
Refills for the methadone and oxycodone were given.

## 2018-03-21 ENCOUNTER — Telehealth: Payer: Self-pay | Admitting: Neurology

## 2018-03-21 NOTE — Telephone Encounter (Signed)
Pt requesting refills for oxyCODONE (ROXICODONE) 15 MG immediate release tablet and methadone (DOLOPHINE) 10 MG tablet sent to [facility]

## 2018-03-21 NOTE — Telephone Encounter (Signed)
Medication will be due on 22 March 2018, the [facility] registry was checked.

## 2018-03-22 MED ORDER — METHADONE HCL 10 MG PO TABS: 10.0000 mg | ORAL_TABLET | Freq: Three times a day (TID) | ORAL | 0 refills | Status: DC

## 2018-03-22 MED ORDER — OXYCODONE HCL 15 MG PO TABS: 15.0000 mg | ORAL_TABLET | Freq: Four times a day (QID) | ORAL | 0 refills | Status: DC | PRN

## 2018-04-18 ENCOUNTER — Telehealth: Payer: Self-pay | Admitting: Neurology

## 2018-04-18 NOTE — Telephone Encounter (Signed)
Patient requesting refill of oxyCODONE (ROXICODONE) 15 MG immediate release tablet and methadone (DOLOPHINE) 10 MG tablet sent to [facility].

## 2018-04-18 NOTE — Telephone Encounter (Signed)
The [facility] registry was checked, the prescriptions for oxycodone and methadone are not due until 19 April 2018.

## 2018-04-19 MED ORDER — METHADONE HCL 10 MG PO TABS: 10.0000 mg | ORAL_TABLET | Freq: Three times a day (TID) | ORAL | 0 refills | Status: DC

## 2018-04-19 MED ORDER — OXYCODONE HCL 15 MG PO TABS: 15.0000 mg | ORAL_TABLET | Freq: Four times a day (QID) | ORAL | 0 refills | Status: DC | PRN

## 2018-04-19 NOTE — Telephone Encounter (Signed)
The Rx for the pain medications was sent today

## 2018-04-19 NOTE — Addendum Note (Signed)
Addended by: York SpanielWILLIS, CHARLES K on: 04/19/2018 03:02 PM   Modules accepted: Orders

## 2018-04-28 ENCOUNTER — Emergency Department (HOSPITAL_COMMUNITY)
Admission: EM | Admit: 2018-04-28 | Discharge: 2018-04-28 | Disposition: A | Discharge status: Home / Self Care | Payer: Medicare Other | Attending: Emergency Medicine | Admitting: Emergency Medicine

## 2018-04-28 ENCOUNTER — Encounter (HOSPITAL_COMMUNITY): Payer: Self-pay

## 2018-04-28 DIAGNOSIS — I10 Essential (primary) hypertension: Secondary | ICD-10-CM | POA: Diagnosis not present

## 2018-04-28 DIAGNOSIS — K5900 Constipation, unspecified: Secondary | ICD-10-CM | POA: Diagnosis not present

## 2018-04-28 DIAGNOSIS — F17210 Nicotine dependence, cigarettes, uncomplicated: Secondary | ICD-10-CM | POA: Diagnosis not present

## 2018-04-28 DIAGNOSIS — Z79899 Other long term (current) drug therapy: Secondary | ICD-10-CM | POA: Diagnosis not present

## 2018-04-28 DIAGNOSIS — R1084 Generalized abdominal pain: Secondary | ICD-10-CM | POA: Diagnosis not present

## 2018-04-28 LAB — CBC WITH DIFFERENTIAL/PLATELET
Abs Immature Granulocytes: 0.01 10*3/uL (ref 0.00–0.07)
BASOS ABS: 0.1 10*3/uL (ref 0.0–0.1)
BASOS PCT: 1 %
Eosinophils Absolute: 0.2 10*3/uL (ref 0.0–0.5)
Eosinophils Relative: 3 %
HCT: 44.2 % (ref 39.0–52.0)
Hemoglobin: 14.8 g/dL (ref 13.0–17.0)
Immature Granulocytes: 0 %
Lymphocytes Relative: 40 %
Lymphs Abs: 2.7 10*3/uL (ref 0.7–4.0)
MCH: 30 pg (ref 26.0–34.0)
MCHC: 33.5 g/dL (ref 30.0–36.0)
MCV: 89.7 fL (ref 80.0–100.0)
Monocytes Absolute: 0.5 10*3/uL (ref 0.1–1.0)
Monocytes Relative: 8 %
Neutro Abs: 3.3 10*3/uL (ref 1.7–7.7)
Neutrophils Relative %: 48 %
Platelets: 241 10*3/uL (ref 150–400)
RBC: 4.93 MIL/uL (ref 4.22–5.81)
RDW: 11.6 % (ref 11.5–15.5)
WBC: 6.8 10*3/uL (ref 4.0–10.5)
nRBC: 0 % (ref 0.0–0.2)

## 2018-04-28 LAB — COMPREHENSIVE METABOLIC PANEL
ALT: 28 U/L (ref 0–44)
AST: 26 U/L (ref 15–41)
Albumin: 4.1 g/dL (ref 3.5–5.0)
Alkaline Phosphatase: 80 U/L (ref 38–126)
Anion gap: 11 (ref 5–15)
BUN: 14 mg/dL (ref 6–20)
CO2: 26 mmol/L (ref 22–32)
Calcium: 9 mg/dL (ref 8.9–10.3)
Chloride: 103 mmol/L (ref 98–111)
Creatinine, Ser: 1.01 mg/dL (ref 0.61–1.24)
GFR calc Af Amer: >60 mL/min (ref >60)
GFR calc non Af Amer: >60 mL/min (ref >60)
GLUCOSE: 95 mg/dL (ref 70–99)
Potassium: 4.1 mmol/L (ref 3.5–5.1)
Sodium: 140 mmol/L (ref 135–145)
TOTAL PROTEIN: 7 g/dL (ref 6.5–8.1)
Total Bilirubin: 0.7 mg/dL (ref 0.3–1.2)

## 2018-04-28 NOTE — Discharge Instructions (Addendum)
Please know you are welcome to return to the ED anytime you feel you want to be evaluated.

## 2018-04-28 NOTE — ED Triage Notes (Signed)
C/o constipation x14 days. Hx sci on chronic pain meds, pt taking miralax x12days, stool softners, suppositories, enemas with no relief.

## 2018-04-28 NOTE — ED Notes (Signed)
Pt refuses to get iv started or have Ct scan. PA notfied and is at bedside talking to pt at this time.
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
## 2018-05-16 ENCOUNTER — Other Ambulatory Visit: Payer: Self-pay | Admitting: Neurology

## 2018-05-16 MED ORDER — METHADONE HCL 10 MG PO TABS: 10.0000 mg | ORAL_TABLET | Freq: Three times a day (TID) | ORAL | 0 refills | Status: DC

## 2018-05-16 MED ORDER — OXYCODONE HCL 15 MG PO TABS: 15.0000 mg | ORAL_TABLET | Freq: Four times a day (QID) | ORAL | 0 refills | Status: DC | PRN

## 2018-05-16 NOTE — Telephone Encounter (Signed)
Refill requests for methadone 10 mg and oxycodone 15 mg. Methadone 10 mg was last refilled on 04/19/2018 # 90 for a 28 day supply, provided by our office. Oxycodone 15 mg was last refilled on 04/19/2018 # 90 for a 28 day supply, provided by our office.  Patient's last office visit was 07/08/2017 and next f/u is scheduled for 05/25/2018. MB RN

## 2018-05-16 NOTE — Telephone Encounter (Signed)
Pt request refill for methadone (DOLOPHINE) 10 MG tablet and oxyCODONE (ROXICODONE) 15 MG immediate release tablet sent to [facility].

## 2018-05-16 NOTE — Telephone Encounter (Signed)
The prescriptions for methadone and oxycodone were sent in. 

## 2018-05-25 ENCOUNTER — Ambulatory Visit (INDEPENDENT_AMBULATORY_CARE_PROVIDER_SITE_OTHER): Payer: Medicare Other | Admitting: Neurology

## 2018-05-25 ENCOUNTER — Telehealth: Payer: Self-pay | Admitting: Neurology

## 2018-05-25 ENCOUNTER — Encounter: Payer: Self-pay | Admitting: Neurology

## 2018-05-25 VITALS — BP 135/95 | HR 85 | Ht 71.0 in | Wt 179.0 lb

## 2018-05-25 DIAGNOSIS — F1190 Opioid use, unspecified, uncomplicated: Secondary | ICD-10-CM | POA: Diagnosis not present

## 2018-05-25 DIAGNOSIS — M4714 Other spondylosis with myelopathy, thoracic region: Secondary | ICD-10-CM

## 2018-05-25 DIAGNOSIS — R269 Unspecified abnormalities of gait and mobility: Secondary | ICD-10-CM

## 2018-05-25 DIAGNOSIS — G894 Chronic pain syndrome: Secondary | ICD-10-CM

## 2018-05-25 DIAGNOSIS — G8220 Paraplegia, unspecified: Secondary | ICD-10-CM

## 2018-05-25 MED ORDER — DIAZEPAM 10 MG PO TABS: 10.0000 mg | ORAL_TABLET | Freq: Three times a day (TID) | ORAL | 5 refills | Status: DC

## 2018-05-25 NOTE — Telephone Encounter (Addendum)
Pt states when he saw Dr Anne Hahn earlier today there was a discussion of pt's pain management.  Pt is now asking the referral be initiated for pain management. Pt would like to be referred to Dr Thyra Breed @ [facility]

## 2018-05-25 NOTE — Progress Notes (Signed)
Reason for visit: Paraplegia, thoracic myelopathy  Jason Dominguez is an 38 y.o. male  History of present illness:  Jason Dominguez is a 38 year old right-handed white male with a history of a thoracic myelopathy, spinal cord injury associated with chronic pain.  The patient has had issues with depression as well in the past.  The patient has currently been living with his parents, he is driving a car.  He is having ongoing pain, he is having some arthritic type pain involving the right hip and right knee, the spasticity in the right leg is much higher.  The pain in the right hip comes on with weightbearing.  He uses a walker for ambulation, he may fall on occasion but he has not sustained any significant injury.  The patient was in the hospital on 28 April 2018 with some constipation issues, he has been able to regulate his bowels fairly well.  The patient has had fairly good emotional stability recently.  He returns to this office for an evaluation.  Past Medical History:  Diagnosis Date  . Alcohol abuse   . Anxiety   . Chronic back pain   . Chronic pain syndrome 11/26/2015  . Depression   . Drug abuse (HCC)   . Gait disorder   . History of kidney stones   . Hypertension   . 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 Thoracic spondylosis with myelopathy 10/03/2012    Past Surgical History:  Procedure Laterality Date  . BACK SURGERY     T4 spinal fusion with Harrington rods  . HERNIA REPAIR     stomach per pt  . MANDIBLE FRACTURE SURGERY     Cosmetic     No family history on file.  Social history:  reports that he has been smoking cigarettes. He has a 12.00 pack-year smoking history. He has never used smokeless tobacco. He reports that he does not drink alcohol or use drugs.   No Known Allergies  Medications:  Prior to Admission medications   Medication Sig Start Date End Date Taking? Authorizing Provider  baclofen (LIORESAL) 20 MG tablet One  tablet twice daily and two at bedtime Patient taking differently: Take 20-40 mg by mouth See admin instructions. Take 1 tablet twice daily and take 2 tablets at bedtime 05/02/14  Yes [name], MD  diazepam (VALIUM) 10 MG tablet TAKE 1 TABLET BY MOUTH EVERY 8 HOURS Patient taking differently: PRN 12/27/17  Yes [name], MD  methadone (DOLOPHINE) 10 MG tablet Take 1 tablet (10 mg total) by mouth every 8 (eight) hours. Must last 28 days. 05/16/18  Yes [name], MD  oxyCODONE (ROXICODONE) 15 MG immediate release tablet Take 1 tablet (15 mg total) by mouth every 6 (six) hours as needed for pain (Must last 28 days). 05/16/18  Yes [name], MD    ROS:  Out of a complete 14 system review of symptoms, the patient complains only of the following symptoms, and all other reviewed systems are negative.  Right leg stiffness, pain Walking difficulty  Blood pressure (!) 135/95, pulse 85, height 5\' 11"  (1.803 m), weight 179 lb (81.2 kg).  Physical Exam  General: The patient is alert and cooperative at the time of the examination.  Skin: No significant peripheral edema is noted.   Neurologic Exam  Mental status: The patient is alert and oriented x 3 at the time of the examination. The patient has apparent  normal recent and remote memory, with an apparently normal attention span and concentration ability.   Cranial nerves: Facial symmetry is present. Speech is normal, no aphasia or dysarthria is noted. Extraocular movements are full. Visual fields are full.  Motor: The patient has good strength the upper extremities.  With the lower extremities, there is heightened muscle tone, right greater than left, bilateral ankle clonus, right greater than left  Sensory examination: Soft touch sensation is symmetric on the face, arms, and legs.  Coordination: The patient has good finger-nose-finger bilaterally.  The patient is not able to perform heel-to-shin on either  side.  Gait and station: The patient has a spastic, diplegic gait, some circumduction with the right leg.  The patient walks with a walker.  Reflexes: Deep tendon reflexes are symmetric, but are brisk in the lower extremities..   Assessment/Plan:  1.  Thoracic myelopathy  2.  Gait disturbance  3.  Chronic pain syndrome  The patient is on chronic opiate therapy for his chronic pain.  The patient is able to maintain his baseline functional level, he walks slowly but is able to get around with a walker.  He is able to operate a motor vehicle.  I have suggested getting a baclofen pump, the patient does not wish to pursue this option.  I will be retiring in October 2020, the patient will need to be referred to a pain center, I will try to get this set up.  The patient will follow-up here in 6 months.  We will get a drug screen today.  Marlan Palau MD 05/25/2018 9:05 AM  [facility] [address] [place], [place] [address]  Phone [phone] Fax [phone]

## 2018-05-25 NOTE — Telephone Encounter (Signed)
Will fwd to referrals.

## 2018-05-30 ENCOUNTER — Telehealth: Payer: Self-pay

## 2018-05-30 DIAGNOSIS — S24109S Unspecified injury at unspecified level of thoracic spinal cord, sequela: Secondary | ICD-10-CM

## 2018-05-30 NOTE — Telephone Encounter (Signed)
Noted Referral has been sent via Fax . Thanks Annabelle Harman

## 2018-05-30 NOTE — Telephone Encounter (Signed)
I will make a referral to Dr. Thyra Breed.

## 2018-05-30 NOTE — Telephone Encounter (Signed)
Patient stated that he needs a referral for Dr. Thyra Breed at [facility] [address] [place], [place] [address] [phone]. Patient would prefer to go to this pain clinic instead of the referral place. Patient mentioned that his uncle is going to help get started financially with this referral.

## 2018-05-30 NOTE — Telephone Encounter (Signed)
Patient is following up on his referral for pain management.

## 2018-05-30 NOTE — Addendum Note (Signed)
Addended by: [name] on: 05/30/2018 04:37 PM   Modules accepted: Orders

## 2018-06-07 LAB — BENZODIAZEPINES,MS,WB/SP RFX
7-Aminoclonazepam: NEGATIVE ng/mL
Alprazolam: NEGATIVE ng/mL
Benzodiazepines Confirm: POSITIVE
Chlordiazepoxide: NEGATIVE ng/mL
Clonazepam: NEGATIVE ng/mL
DESMETHYLDIAZEPAM: 296 ng/mL
DIAZEPAM: 275 ng/mL
Desalkylflurazepam: NEGATIVE ng/mL
Desmethylchlordiazepoxide: NEGATIVE ng/mL
Flurazepam: NEGATIVE ng/mL
Lorazepam: NEGATIVE ng/mL
MIDAZOLAM: NEGATIVE ng/mL
Oxazepam: 20 ng/mL
Temazepam: 14 ng/mL
Triazolam: NEGATIVE ng/mL

## 2018-06-07 LAB — DRUG SCREEN 10 W/CONF, SERUM
Amphetamines, IA: NEGATIVE ng/mL
Barbiturates, IA: NEGATIVE ug/mL
Benzodiazepines, IA: POSITIVE ng/mL — AB
Cocaine & Metabolite, IA: NEGATIVE ng/mL
Methadone, IA: POSITIVE ng/mL — AB
Opiates, IA: NEGATIVE ng/mL
Oxycodones, IA: NEGATIVE ng/mL
Phencyclidine, IA: NEGATIVE ng/mL
Propoxyphene, IA: NEGATIVE ng/mL
THC(Marijuana) Metabolite, IA: POSITIVE ng/mL — AB

## 2018-06-07 LAB — METHADONE,MS,WB/SP RFX
Methadone Confirmation: POSITIVE
Methadone: 240.7 ng/mL

## 2018-06-07 LAB — THC,MS,WB/SP RFX
CARBOXY-THC: 17.1 ng/mL
Cannabidiol: NEGATIVE ng/mL
Cannabinoid Confirmation: POSITIVE
Cannabinol: NEGATIVE ng/mL
Hydroxy-THC: NEGATIVE ng/mL
Tetrahydrocannabinol(THC): NEGATIVE ng/mL

## 2018-06-13 ENCOUNTER — Other Ambulatory Visit: Payer: Self-pay | Admitting: Neurology

## 2018-06-13 MED ORDER — METHADONE HCL 10 MG PO TABS: 10.0000 mg | ORAL_TABLET | Freq: Three times a day (TID) | ORAL | 0 refills | Status: DC

## 2018-06-13 MED ORDER — OXYCODONE HCL 15 MG PO TABS: 15.0000 mg | ORAL_TABLET | Freq: Four times a day (QID) | ORAL | 0 refills | Status: DC | PRN

## 2018-06-13 NOTE — Telephone Encounter (Signed)
Pt requesting refills of Oxycodone 15 mg and Methadone 10 mg.  [place] drug registry verified.  Oxycodone was last refilled on 05/16/18 # 90 for a 30 day supply. Methadone was last refilled on 05/09/18 # 90 for a 30 day supply. Both prescriptions provided by Dr. Anne Hahn.  Last o/v was 07/08/17 and next f/u is scheduled for 11/30/18.

## 2018-06-13 NOTE — Telephone Encounter (Signed)
Pt is asking for a refill on his  methadone (DOLOPHINE) 10 MG tablet and  oxyCODONE (ROXICODONE) 15 MG immediate release [facility] *no changes to insurance*

## 2018-06-13 NOTE — Addendum Note (Signed)
Addended by: [name] on: 06/13/2018 04:48 PM   Modules accepted: Orders

## 2018-06-13 NOTE — Telephone Encounter (Signed)
The methadone and oxycodone prescriptions will be refilled.

## 2018-07-11 ENCOUNTER — Other Ambulatory Visit: Payer: Self-pay | Admitting: Neurology

## 2018-07-11 MED ORDER — METHADONE HCL 10 MG PO TABS: 10.0000 mg | ORAL_TABLET | Freq: Three times a day (TID) | ORAL | 0 refills | Status: DC

## 2018-07-11 MED ORDER — OXYCODONE HCL 15 MG PO TABS: 15.0000 mg | ORAL_TABLET | Freq: Four times a day (QID) | ORAL | 0 refills | Status: DC | PRN

## 2018-07-11 NOTE — Telephone Encounter (Signed)
Needs refills of oxycodone and methadone. Best call back is [phone]

## 2018-07-11 NOTE — Telephone Encounter (Signed)
Pt requesting refills on Methadone 10 mg and Oxycodone 15 mg. Methadone was last refilled on 06/13/18 # 90 day for a 30 day supply provided by Dr. Anne Hahn. Oxycodone was last refilled on 06/13/18 # 90 for a 28 day supply provided by Dr. Anne Hahn. Pt has been compliant on f/u's.

## 2018-08-08 ENCOUNTER — Telehealth: Payer: Self-pay | Admitting: Neurology

## 2018-08-08 MED ORDER — METHADONE HCL 10 MG PO TABS: 10.0000 mg | ORAL_TABLET | Freq: Three times a day (TID) | ORAL | 0 refills | Status: DC

## 2018-08-08 MED ORDER — OXYCODONE HCL 15 MG PO TABS: 15.0000 mg | ORAL_TABLET | Freq: Four times a day (QID) | ORAL | 0 refills | Status: DC | PRN

## 2018-08-08 NOTE — Telephone Encounter (Signed)
Patient calling for a refill Oxycodone and methadone . [facility].

## 2018-08-08 NOTE — Telephone Encounter (Signed)
Error

## 2018-08-08 NOTE — Telephone Encounter (Signed)
The patient called, needs a refill on his medications, prescriptions were sent in.

## 2018-08-08 NOTE — Telephone Encounter (Signed)
The prescription was sent in. 

## 2018-09-05 ENCOUNTER — Telehealth: Payer: Self-pay | Admitting: Neurology

## 2018-09-05 ENCOUNTER — Other Ambulatory Visit: Payer: Self-pay | Admitting: Neurology

## 2018-09-05 DIAGNOSIS — G894 Chronic pain syndrome: Secondary | ICD-10-CM

## 2018-09-05 MED ORDER — METHADONE HCL 10 MG PO TABS: 10.0000 mg | ORAL_TABLET | Freq: Three times a day (TID) | ORAL | 0 refills | Status: DC

## 2018-09-05 MED ORDER — OXYCODONE HCL 15 MG PO TABS: 15.0000 mg | ORAL_TABLET | Freq: Four times a day (QID) | ORAL | 0 refills | Status: DC | PRN

## 2018-09-05 NOTE — Telephone Encounter (Signed)
Pt called in  For a request for a referral to be sent to [facility] . He states his current referral to Dr Vear Clock cant be done due to them not accepting patients

## 2018-09-05 NOTE — Telephone Encounter (Signed)
The prescriptions for methadone and oxycodone will be sent in.

## 2018-09-05 NOTE — Telephone Encounter (Signed)
I will set up a referral to [facility]

## 2018-09-05 NOTE — Addendum Note (Signed)
Addended by: [name] on: 09/05/2018 11:52 AM   Modules accepted: Orders

## 2018-09-05 NOTE — Telephone Encounter (Signed)
Pt has called for a refill on his methadone (DOLOPHINE) 10 MG tablet oxyCODONE (ROXICODONE) 15 MG immediate release tablet  [facility]

## 2018-09-05 NOTE — Telephone Encounter (Signed)
[place] drug registry verified. Last refill for methadone was 08/08/18 # 90 for a 30 day rx. Lat refill for oxycodone was 08/08/18 # 90 for a 28 day supply.  Both refills were provided by Dr. Anne Hahn. Pt has been complaint with his f/u.s.

## 2018-09-30 ENCOUNTER — Telehealth: Payer: Self-pay | Admitting: Neurology

## 2018-09-30 NOTE — Telephone Encounter (Signed)
Pt has called for a refill on his methadone (DOLOPHINE) 10 MG tablet oxyCODONE (ROXICODONE) 15 MG immediate release tablet  [facility]  Pt advised office not open on Fridays, he was advised RN will call him if she has questions otherwise  to check with pharmacy on Monday

## 2018-10-02 ENCOUNTER — Emergency Department (HOSPITAL_COMMUNITY)
Admission: EM | Admit: 2018-10-02 | Discharge: 2018-10-02 | Disposition: A | Discharge status: Home / Self Care | Payer: Medicare Other | Attending: Emergency Medicine | Admitting: Emergency Medicine

## 2018-10-02 ENCOUNTER — Other Ambulatory Visit: Payer: Self-pay

## 2018-10-02 ENCOUNTER — Encounter (HOSPITAL_COMMUNITY): Payer: Self-pay | Admitting: Emergency Medicine

## 2018-10-02 DIAGNOSIS — F1123 Opioid dependence with withdrawal: Secondary | ICD-10-CM

## 2018-10-02 DIAGNOSIS — R52 Pain, unspecified: Secondary | ICD-10-CM | POA: Diagnosis not present

## 2018-10-02 DIAGNOSIS — I10 Essential (primary) hypertension: Secondary | ICD-10-CM | POA: Insufficient documentation

## 2018-10-02 DIAGNOSIS — F17210 Nicotine dependence, cigarettes, uncomplicated: Secondary | ICD-10-CM | POA: Insufficient documentation

## 2018-10-02 DIAGNOSIS — Z79899 Other long term (current) drug therapy: Secondary | ICD-10-CM | POA: Insufficient documentation

## 2018-10-02 DIAGNOSIS — F1193 Opioid use, unspecified with withdrawal: Secondary | ICD-10-CM

## 2018-10-02 DIAGNOSIS — F29 Unspecified psychosis not due to a substance or known physiological condition: Secondary | ICD-10-CM | POA: Diagnosis not present

## 2018-10-02 DIAGNOSIS — R112 Nausea with vomiting, unspecified: Secondary | ICD-10-CM | POA: Diagnosis not present

## 2018-10-02 LAB — COMPREHENSIVE METABOLIC PANEL
ALT: 30 U/L (ref 0–44)
AST: 25 U/L (ref 15–41)
Albumin: 4.4 g/dL (ref 3.5–5.0)
Alkaline Phosphatase: 95 U/L (ref 38–126)
Anion gap: 9 (ref 5–15)
BUN: 23 mg/dL — ABNORMAL HIGH (ref 6–20)
CO2: 24 mmol/L (ref 22–32)
Calcium: 9.3 mg/dL (ref 8.9–10.3)
Chloride: 107 mmol/L (ref 98–111)
Creatinine, Ser: 0.81 mg/dL (ref 0.61–1.24)
GFR calc Af Amer: >60 mL/min (ref >60)
GFR calc non Af Amer: >60 mL/min (ref >60)
Glucose, Bld: 93 mg/dL (ref 70–99)
Potassium: 3.5 mmol/L (ref 3.5–5.1)
Sodium: 140 mmol/L (ref 135–145)
Total Bilirubin: 0.4 mg/dL (ref 0.3–1.2)
Total Protein: 7.7 g/dL (ref 6.5–8.1)

## 2018-10-02 LAB — CBC WITH DIFFERENTIAL/PLATELET
Abs Immature Granulocytes: 0.06 10*3/uL (ref 0.00–0.07)
Basophils Absolute: 0.1 10*3/uL (ref 0.0–0.1)
Basophils Relative: 0 %
Eosinophils Absolute: 0.1 10*3/uL (ref 0.0–0.5)
Eosinophils Relative: 1 %
HCT: 46.7 % (ref 39.0–52.0)
Hemoglobin: 15.8 g/dL (ref 13.0–17.0)
Immature Granulocytes: 1 %
Lymphocytes Relative: 24 %
Lymphs Abs: 3 10*3/uL (ref 0.7–4.0)
MCH: 30.4 pg (ref 26.0–34.0)
MCHC: 33.8 g/dL (ref 30.0–36.0)
MCV: 90 fL (ref 80.0–100.0)
Monocytes Absolute: 0.9 10*3/uL (ref 0.1–1.0)
Monocytes Relative: 8 %
Neutro Abs: 8.5 10*3/uL — ABNORMAL HIGH (ref 1.7–7.7)
Neutrophils Relative %: 66 %
Platelets: 272 10*3/uL (ref 150–400)
RBC: 5.19 MIL/uL (ref 4.22–5.81)
RDW: 11.8 % (ref 11.5–15.5)
WBC: 12.6 10*3/uL — ABNORMAL HIGH (ref 4.0–10.5)
nRBC: 0 % (ref 0.0–0.2)

## 2018-10-02 MED ORDER — HYDROMORPHONE HCL 1 MG/ML IJ SOLN
1.0000 mg | Freq: Once | INTRAMUSCULAR | Status: AC
  Administered 2018-10-02: 06:00:00 1 mg via INTRAVENOUS
  Filled 2018-10-02: qty 1

## 2018-10-02 MED ORDER — METHADONE HCL 5 MG PO TABS
10.0000 mg | ORAL_TABLET | Freq: Three times a day (TID) | ORAL | Status: DC
  Administered 2018-10-02: 07:00:00 10 mg via ORAL
  Filled 2018-10-02: qty 2

## 2018-10-02 MED ORDER — BACLOFEN 10 MG PO TABS
20.0000 mg | ORAL_TABLET | Freq: Once | ORAL | Status: AC
  Administered 2018-10-02: 20 mg via ORAL
  Filled 2018-10-02: qty 2

## 2018-10-02 MED ORDER — ONDANSETRON 4 MG PO TBDP: 4.0000 mg | ORAL_TABLET | Freq: Three times a day (TID) | ORAL | 0 refills | Status: AC | PRN

## 2018-10-02 MED ORDER — KETOROLAC TROMETHAMINE 30 MG/ML IJ SOLN
30.0000 mg | Freq: Once | INTRAMUSCULAR | Status: AC
  Administered 2018-10-02: 05:00:00 30 mg via INTRAVENOUS
  Filled 2018-10-02: qty 1

## 2018-10-02 MED ORDER — DIAZEPAM 5 MG PO TABS
10.0000 mg | ORAL_TABLET | Freq: Once | ORAL | Status: AC
  Administered 2018-10-02: 06:00:00 10 mg via ORAL
  Filled 2018-10-02: qty 2

## 2018-10-02 MED ORDER — ONDANSETRON HCL 4 MG/2ML IJ SOLN
4.0000 mg | Freq: Once | INTRAMUSCULAR | Status: AC
  Administered 2018-10-02: 05:00:00 4 mg via INTRAVENOUS
  Filled 2018-10-02: qty 2

## 2018-10-02 MED ORDER — HYDROMORPHONE HCL 1 MG/ML IJ SOLN
0.5000 mg | Freq: Once | INTRAMUSCULAR | Status: AC
  Administered 2018-10-02: 05:00:00 0.5 mg via INTRAVENOUS
  Filled 2018-10-02: qty 1

## 2018-10-02 MED ORDER — SODIUM CHLORIDE 0.9 % IV BOLUS
1000.0000 mL | Freq: Once | INTRAVENOUS | Status: AC
  Administered 2018-10-02: 06:00:00 1000 mL via INTRAVENOUS
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
## 2018-10-02 NOTE — ED Notes (Signed)
Bed: WA20 Expected date:  Expected time:  Means of arrival:  Comments: Ems-N/V/D due to detox
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
## 2018-10-03 ENCOUNTER — Telehealth: Payer: Self-pay | Admitting: Neurology

## 2018-10-03 DIAGNOSIS — G894 Chronic pain syndrome: Secondary | ICD-10-CM

## 2018-10-03 MED ORDER — OXYCODONE HCL 15 MG PO TABS: 15.0000 mg | ORAL_TABLET | Freq: Four times a day (QID) | ORAL | 0 refills | Status: DC | PRN

## 2018-10-03 MED ORDER — METHADONE HCL 10 MG PO TABS: 10.0000 mg | ORAL_TABLET | Freq: Three times a day (TID) | ORAL | 0 refills | Status: DC
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
## 2018-10-03 NOTE — Telephone Encounter (Signed)
[place] drug registry verified. Last refill for Methadone and Oxycodone were 09/05/18. Both # 90 for a 30 day supply provided by Dr. Anne Hahn.

## 2018-10-03 NOTE — Addendum Note (Signed)
Addended by: [name] on: 10/03/2018 07:54 AM   Modules accepted: Orders

## 2018-10-03 NOTE — Telephone Encounter (Signed)
The prescriptions for methadone and oxycodone will be refilled.

## 2018-10-17 NOTE — Addendum Note (Signed)
Addended by: [name] on: 10/17/2018 01:25 PM   Modules accepted: Orders

## 2018-10-17 NOTE — Telephone Encounter (Signed)
Pt called and stated that he has yet to hear from the pain management place. Please advise.

## 2018-10-17 NOTE — Telephone Encounter (Signed)
I called the patient.  He has not yet heard anything from [facility], I will make another referral to help manage the pain.

## 2018-10-31 ENCOUNTER — Telehealth: Payer: Self-pay | Admitting: Neurology

## 2018-10-31 MED ORDER — OXYCODONE HCL 15 MG PO TABS: 15.0000 mg | ORAL_TABLET | Freq: Four times a day (QID) | ORAL | 0 refills | Status: DC | PRN

## 2018-10-31 MED ORDER — METHADONE HCL 10 MG PO TABS: 10.0000 mg | ORAL_TABLET | Freq: Three times a day (TID) | ORAL | 0 refills | Status: DC

## 2018-10-31 NOTE — Telephone Encounter (Signed)
Pt is calling in for a refill of oxyCODONE (ROXICODONE) 15 MG immediate release tablet and methadone (DOLOPHINE) 10 MG tablet to be sent to [place], [place]

## 2018-10-31 NOTE — Telephone Encounter (Signed)
[place] drug registry verified. Last refill for both oxycodone and methadone was 10/03/18 both # 90 provided by Dr. Jannifer Franklin.

## 2018-10-31 NOTE — Telephone Encounter (Signed)
The oxycodone and methadone will be refilled.

## 2018-11-28 ENCOUNTER — Telehealth: Payer: Self-pay | Admitting: Neurology

## 2018-11-28 ENCOUNTER — Other Ambulatory Visit: Payer: Self-pay | Admitting: Neurology

## 2018-11-28 MED ORDER — METHADONE HCL 10 MG PO TABS: 10.0000 mg | ORAL_TABLET | Freq: Three times a day (TID) | ORAL | 0 refills | Status: DC

## 2018-11-28 MED ORDER — OXYCODONE HCL 15 MG PO TABS: 15.0000 mg | ORAL_TABLET | Freq: Four times a day (QID) | ORAL | 0 refills | Status: DC | PRN

## 2018-11-28 NOTE — Telephone Encounter (Signed)
The patient called and stated that he needs a refill on rx Oxycodone and rx Methadone.

## 2018-11-28 NOTE — Telephone Encounter (Signed)
The prescriptions for oxycodone and methadone will be sent in. 

## 2018-11-28 NOTE — Telephone Encounter (Signed)
[place] drug registry verified. Last refill for both medications were 10/03/18 1 for a 28 day supply the other for 30.

## 2018-11-30 ENCOUNTER — Encounter: Payer: Self-pay | Admitting: Neurology

## 2018-11-30 ENCOUNTER — Other Ambulatory Visit: Payer: Self-pay

## 2018-11-30 ENCOUNTER — Ambulatory Visit (INDEPENDENT_AMBULATORY_CARE_PROVIDER_SITE_OTHER): Payer: Medicare Other | Admitting: Neurology

## 2018-11-30 VITALS — BP 136/86 | HR 96 | Temp 97.7°F

## 2018-11-30 DIAGNOSIS — G8220 Paraplegia, unspecified: Secondary | ICD-10-CM | POA: Diagnosis not present

## 2018-11-30 DIAGNOSIS — F1190 Opioid use, unspecified, uncomplicated: Secondary | ICD-10-CM | POA: Diagnosis not present

## 2018-11-30 DIAGNOSIS — R269 Unspecified abnormalities of gait and mobility: Secondary | ICD-10-CM

## 2018-11-30 DIAGNOSIS — G894 Chronic pain syndrome: Secondary | ICD-10-CM

## 2018-11-30 NOTE — Progress Notes (Signed)
Reason for visit: Spinal cord injury, paraparesis, chronic pain syndrome  Jason Dominguez is an 38 y.o. male  History of present illness:  Jason Dominguez is a 38 year old right-handed white male with a history of a thoracic spinal cord injury with chronic pain associated with this.  The patient is ambulatory, but he has significant spasticity involving the right leg, the left leg functions much better.  He claims that recently he has begun taking CBD oil 5 mg twice daily which seems to have had a very significant positive benefit on his level of spasticity and has made it easier for him to ambulate with his walker.  The patient has not had recent falls, he is on chronic opioid therapy for his pain.  When he has the medication, he functions fairly well.  The patient currently is splitting his time between an apartment on the [place] when he sees his daughter 2 weeks a month, and then he spends other 2 weeks with his parents in this area.  The patient denies any other new medical issues that come up since last seen.  Past Medical History:  Diagnosis Date  . Alcohol abuse   . Anxiety   . Chronic back pain   . Chronic pain syndrome 11/26/2015  . Depression   . Drug abuse ([place])   . Gait disorder   . History of kidney stones   . Hypertension   . 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 Thoracic spondylosis with myelopathy 10/03/2012    Past Surgical History:  Procedure Laterality Date  . BACK SURGERY     T4 spinal fusion with Harrington rods  . HERNIA REPAIR     stomach per pt  . MANDIBLE FRACTURE SURGERY     Cosmetic     History reviewed. No pertinent family history.  Social history:  reports that he has been smoking cigarettes. He has a 12.00 pack-year smoking history. He has never used smokeless tobacco. He reports that he does not drink alcohol or use drugs.   No Known Allergies  Medications:  Prior to Admission medications   Medication Sig  Start Date End Date Taking? Authorizing Provider  baclofen (LIORESAL) 20 MG tablet One tablet twice daily and two at bedtime Patient taking differently: Take 20-40 mg by mouth See admin instructions. Take 1 tablet twice daily and take 2 tablets at bedtime 05/02/14   Kathrynn Ducking, MD  diazepam (VALIUM) 10 MG tablet TAKE 1 TABLET (10 MG TOTAL) BY MOUTH EVERY 8 (EIGHT) HOURS. 11/28/18   Kathrynn Ducking, MD  methadone (DOLOPHINE) 10 MG tablet Take 1 tablet (10 mg total) by mouth every 8 (eight) hours. Must last 28 days. 11/28/18   Kathrynn Ducking, MD  ondansetron (ZOFRAN ODT) 4 MG disintegrating tablet Take 1 tablet (4 mg total) by mouth every 8 (eight) hours as needed for nausea or vomiting. 10/02/18   Antonietta Breach, PA-C  oxyCODONE (ROXICODONE) 15 MG immediate release tablet Take 1 tablet (15 mg total) by mouth every 6 (six) hours as needed for pain (Must last 28 days). 11/28/18   Kathrynn Ducking, MD    ROS:  Out of a complete 14 system review of symptoms, the patient complains only of the following symptoms, and all other reviewed systems are negative.  Chronic pain Walking problem  Blood pressure 136/86, pulse 96, temperature 97.7 F (36.5 C), temperature source Temporal.  Physical Exam  General: The patient is  alert and cooperative at the time of the examination.  Skin: No significant peripheral edema is noted.   Neurologic Exam  Mental status: The patient is alert and oriented x 3 at the time of the examination. The patient has apparent normal recent and remote memory, with an apparently normal attention span and concentration ability.   Cranial nerves: Facial symmetry is present. Speech is normal, no aphasia or dysarthria is noted. Extraocular movements are full. Visual fields are full.  Motor: The patient has good strength in all 4 extremities, but the motor tone is significantly increased with the right leg, when he extends the right leg, he has difficulty flexing it back.   Sensory examination: Soft touch sensation is symmetric on the face, arms, and legs.  Coordination: The patient has good finger-nose-finger and heel-to-shin bilaterally, but he does have some difficulty performing heel-to-shin with the right leg.  Gait and station: The patient has an exaggerated circumduction gait with the right leg.  Gait is wide-based.  Tandem gait was not attempted.  Romberg is negative.  Reflexes: Deep tendon reflexes are symmetric, but are somewhat increased in the legs.   Assessment/Plan:  1.  Spinal cord injury, thoracic level  2.  Chronic pain syndrome  3.  Gait disorder  The patient is doing fairly well at this time, the CBD oil has resulted in a significant improvement in his spasticity of the legs.  We will check a urine drug screen today, we have made several referrals to a pain center for him, he has not yet had anyone respond to him.  [facility]  He will follow-up in 6 months.  Jason Palau. Keith [name] MD 11/30/2018 10:15 AM  [facility] [address] [place], [place] [id number]  Phone [phone] Fax [phone]

## 2018-12-03 LAB — COMPLIANCE DRUG ANALYSIS, UR

## 2018-12-26 ENCOUNTER — Telehealth: Payer: Self-pay | Admitting: Neurology

## 2018-12-26 MED ORDER — OXYCODONE HCL 15 MG PO TABS: 15.0000 mg | ORAL_TABLET | Freq: Four times a day (QID) | ORAL | 0 refills | Status: DC | PRN

## 2018-12-26 MED ORDER — METHADONE HCL 10 MG PO TABS: 10.0000 mg | ORAL_TABLET | Freq: Three times a day (TID) | ORAL | 0 refills | Status: DC

## 2018-12-26 NOTE — Addendum Note (Signed)
Addended by: Kathrynn Ducking on: 12/26/2018 01:13 PM   Modules accepted: Orders

## 2018-12-26 NOTE — Telephone Encounter (Signed)
The methadone and oxycodone were refilled.

## 2018-12-26 NOTE — Telephone Encounter (Signed)
Pt has called for a refill on his methadone (DOLOPHINE) 10 MG tablet oxyCODONE (ROXICODONE) 15 MG immediate release tablet baclofen (LIORESAL) 20 MG tablet  [facility]

## 2018-12-26 NOTE — Telephone Encounter (Signed)
[place] drug registry was verified. Last refill for both Oxycodone and Methadone was 11/28/2018 provided by Dr. Jannifer Franklin.  Baclofen has been refilled by our office since 2015, will confirm with MD if ok to send.

## 2019-01-19 ENCOUNTER — Telehealth: Payer: Self-pay | Admitting: Neurology

## 2019-01-19 NOTE — Telephone Encounter (Signed)
Pt states he is aware his meds are due for the refill on Monday but due to the holiday he wanted to submit request today for methadone (DOLOPHINE) 10 MG tablet oxyCODONE (ROXICODONE) 15 MG immediate release tablet  [facility]

## 2019-01-19 NOTE — Telephone Encounter (Signed)
I reviewed the [place] drug registry. Methadone was last refilled on 12/26/2018 for a 30 day supply. Oxycodone was last refilled on 12/26/2018 for a 28 day supply.

## 2019-01-19 NOTE — Telephone Encounter (Signed)
The prescriptions will be due on 23 January 2019, I will send them in then.

## 2019-01-23 MED ORDER — METHADONE HCL 10 MG PO TABS: 10.0000 mg | ORAL_TABLET | Freq: Three times a day (TID) | ORAL | 0 refills | Status: DC

## 2019-01-23 MED ORDER — OXYCODONE HCL 15 MG PO TABS: 15.0000 mg | ORAL_TABLET | Freq: Four times a day (QID) | ORAL | 0 refills | Status: DC | PRN

## 2019-01-23 NOTE — Telephone Encounter (Signed)
The pain medications will be refilled.

## 2019-01-23 NOTE — Addendum Note (Signed)
Addended by: Kathrynn Ducking on: 01/23/2019 11:16 AM   Modules accepted: Orders

## 2019-01-24 ENCOUNTER — Telehealth: Payer: Self-pay | Admitting: Neurology

## 2019-01-24 NOTE — Telephone Encounter (Signed)
Pt is asking for a call to discuss where things are re: him getting into a pain clinic before Dr Jamey Ripa.  Please call to discuss

## 2019-01-24 NOTE — Telephone Encounter (Signed)
I called and left message asking them to call me back about status of referral. Dr. Gabriel Carina  Call's  patient to schedule. Thanks [employer] to [employer] . Telephone [phone]- fax [phone]         Pain mgt. Has declined referral .  [facility] accepted patient but he declined in January.  I have also left a message with Dr. Nicholaus Bloom office as well.

## 2019-01-25 NOTE — Telephone Encounter (Signed)
I reached out to the Jason Dominguez and advised our referral's department has reached out to [facility] to receive the status of this referral. Jason Dominguez states he is worried about how he will be cared for when Dr. Jannifer Franklin partial retires. I assured the Jason Dominguez we are working in our office on how Dr. Jannifer Franklin schedule/calls will be taken care of when he is out of the office.

## 2019-01-26 NOTE — Telephone Encounter (Signed)
I called the patient.  He has no problem with going to [facility] pain center, if he has been accepted then he will go.

## 2019-01-26 NOTE — Telephone Encounter (Signed)
Called patient back and left him a message asking him to call [phone] ext [phone] and  That would get him to [facility] and if he had any problems to call me back. I will follow back up with Estill Bamberg to make sure he is scheduled .

## 2019-01-26 NOTE — Telephone Encounter (Signed)
[facility] accepted patient . But patient refused he did not want to go there.  Dr. Nicholaus Bloom ,  [facility] . Have denied seeing patient . Dr. Jannifer Franklin where should I send him next ? Should I try [facility] ?

## 2019-02-02 NOTE — Telephone Encounter (Signed)
Patient has apt with [facility] 02/27/2019 . Patient is aware of his apt and he needs to keep apt. [name]

## 2019-02-20 ENCOUNTER — Telehealth: Payer: Self-pay | Admitting: Neurology

## 2019-02-20 NOTE — Telephone Encounter (Signed)
Pt is requesting a refill of oxyCODONE (ROXICODONE) 15 MG immediate release tablet and methadone (DOLOPHINE) 10 MG tablet , to be sent to [place], [place]

## 2019-02-20 NOTE — Telephone Encounter (Signed)
The prescriptions will be due on 21 February 2019.

## 2019-02-20 NOTE — Telephone Encounter (Signed)
[place] drug registry has been verified. Last refills for both medications were 01/24/2019 # 90 for both 30 day/28 day supply.

## 2019-02-21 MED ORDER — OXYCODONE HCL 15 MG PO TABS: 15.0000 mg | ORAL_TABLET | Freq: Four times a day (QID) | ORAL | 0 refills | Status: AC | PRN

## 2019-02-21 MED ORDER — METHADONE HCL 10 MG PO TABS: 10.0000 mg | ORAL_TABLET | Freq: Three times a day (TID) | ORAL | 0 refills | Status: AC

## 2019-02-21 NOTE — Telephone Encounter (Signed)
The opiate medications will be prescribed today.  The patient has been referred to a pain center, hopefully will be able to get his medications outside of this office from now on.

## 2019-02-27 DIAGNOSIS — Z79899 Other long term (current) drug therapy: Secondary | ICD-10-CM | POA: Diagnosis not present

## 2019-02-27 DIAGNOSIS — F4542 Pain disorder with related psychological factors: Secondary | ICD-10-CM | POA: Diagnosis not present

## 2019-02-27 DIAGNOSIS — E559 Vitamin D deficiency, unspecified: Secondary | ICD-10-CM | POA: Diagnosis not present

## 2019-02-27 DIAGNOSIS — M4714 Other spondylosis with myelopathy, thoracic region: Secondary | ICD-10-CM | POA: Diagnosis not present

## 2019-02-27 DIAGNOSIS — M129 Arthropathy, unspecified: Secondary | ICD-10-CM | POA: Diagnosis not present

## 2019-02-27 DIAGNOSIS — I10 Essential (primary) hypertension: Secondary | ICD-10-CM | POA: Diagnosis not present

## 2019-03-23 DIAGNOSIS — F4323 Adjustment disorder with mixed anxiety and depressed mood: Secondary | ICD-10-CM | POA: Diagnosis not present

## 2019-03-24 DIAGNOSIS — F17210 Nicotine dependence, cigarettes, uncomplicated: Secondary | ICD-10-CM | POA: Diagnosis not present

## 2019-03-24 DIAGNOSIS — Z7189 Other specified counseling: Secondary | ICD-10-CM | POA: Diagnosis not present

## 2019-03-24 DIAGNOSIS — Z79899 Other long term (current) drug therapy: Secondary | ICD-10-CM | POA: Diagnosis not present

## 2019-04-21 DIAGNOSIS — F17210 Nicotine dependence, cigarettes, uncomplicated: Secondary | ICD-10-CM | POA: Diagnosis not present

## 2019-04-21 DIAGNOSIS — Z79899 Other long term (current) drug therapy: Secondary | ICD-10-CM | POA: Diagnosis not present

## 2019-04-24 DIAGNOSIS — E7800 Pure hypercholesterolemia, unspecified: Secondary | ICD-10-CM | POA: Diagnosis not present

## 2019-06-08 ENCOUNTER — Ambulatory Visit: Payer: Self-pay | Admitting: Neurology

## 2019-06-19 DIAGNOSIS — M4714 Other spondylosis with myelopathy, thoracic region: Secondary | ICD-10-CM | POA: Diagnosis not present

## 2019-06-19 DIAGNOSIS — F17210 Nicotine dependence, cigarettes, uncomplicated: Secondary | ICD-10-CM | POA: Diagnosis not present

## 2019-06-19 DIAGNOSIS — R261 Paralytic gait: Secondary | ICD-10-CM | POA: Diagnosis not present

## 2019-06-19 DIAGNOSIS — Z79899 Other long term (current) drug therapy: Secondary | ICD-10-CM | POA: Diagnosis not present

## 2019-06-19 DIAGNOSIS — M62838 Other muscle spasm: Secondary | ICD-10-CM | POA: Diagnosis not present

## 2019-07-19 DIAGNOSIS — Z79899 Other long term (current) drug therapy: Secondary | ICD-10-CM | POA: Diagnosis not present

## 2019-07-19 DIAGNOSIS — R261 Paralytic gait: Secondary | ICD-10-CM | POA: Diagnosis not present

## 2019-07-19 DIAGNOSIS — M4714 Other spondylosis with myelopathy, thoracic region: Secondary | ICD-10-CM | POA: Diagnosis not present

## 2019-07-19 DIAGNOSIS — F17210 Nicotine dependence, cigarettes, uncomplicated: Secondary | ICD-10-CM | POA: Diagnosis not present

## 2019-08-21 DIAGNOSIS — R261 Paralytic gait: Secondary | ICD-10-CM | POA: Diagnosis not present

## 2019-08-21 DIAGNOSIS — M4714 Other spondylosis with myelopathy, thoracic region: Secondary | ICD-10-CM | POA: Diagnosis not present

## 2019-08-21 DIAGNOSIS — F17210 Nicotine dependence, cigarettes, uncomplicated: Secondary | ICD-10-CM | POA: Diagnosis not present

## 2019-08-21 DIAGNOSIS — Z79899 Other long term (current) drug therapy: Secondary | ICD-10-CM | POA: Diagnosis not present

## 2019-09-20 DIAGNOSIS — M4714 Other spondylosis with myelopathy, thoracic region: Secondary | ICD-10-CM | POA: Diagnosis not present

## 2019-09-20 DIAGNOSIS — F17210 Nicotine dependence, cigarettes, uncomplicated: Secondary | ICD-10-CM | POA: Diagnosis not present

## 2019-09-20 DIAGNOSIS — R261 Paralytic gait: Secondary | ICD-10-CM | POA: Diagnosis not present

## 2019-09-20 DIAGNOSIS — Z79899 Other long term (current) drug therapy: Secondary | ICD-10-CM | POA: Diagnosis not present

## 2019-10-19 DIAGNOSIS — R261 Paralytic gait: Secondary | ICD-10-CM | POA: Diagnosis not present

## 2019-10-19 DIAGNOSIS — F17210 Nicotine dependence, cigarettes, uncomplicated: Secondary | ICD-10-CM | POA: Diagnosis not present

## 2019-10-19 DIAGNOSIS — M4714 Other spondylosis with myelopathy, thoracic region: Secondary | ICD-10-CM | POA: Diagnosis not present

## 2019-10-19 DIAGNOSIS — Z79899 Other long term (current) drug therapy: Secondary | ICD-10-CM | POA: Diagnosis not present

## 2019-12-18 DIAGNOSIS — M4714 Other spondylosis with myelopathy, thoracic region: Secondary | ICD-10-CM | POA: Diagnosis not present

## 2019-12-18 DIAGNOSIS — R261 Paralytic gait: Secondary | ICD-10-CM | POA: Diagnosis not present

## 2019-12-18 DIAGNOSIS — F17210 Nicotine dependence, cigarettes, uncomplicated: Secondary | ICD-10-CM | POA: Diagnosis not present

## 2019-12-18 DIAGNOSIS — M62838 Other muscle spasm: Secondary | ICD-10-CM | POA: Diagnosis not present

## 2019-12-18 DIAGNOSIS — Z79899 Other long term (current) drug therapy: Secondary | ICD-10-CM | POA: Diagnosis not present

## 2020-01-15 DIAGNOSIS — Z79899 Other long term (current) drug therapy: Secondary | ICD-10-CM | POA: Diagnosis not present

## 2020-01-15 DIAGNOSIS — F17210 Nicotine dependence, cigarettes, uncomplicated: Secondary | ICD-10-CM | POA: Diagnosis not present

## 2020-01-15 DIAGNOSIS — M62838 Other muscle spasm: Secondary | ICD-10-CM | POA: Diagnosis not present

## 2020-01-15 DIAGNOSIS — M4714 Other spondylosis with myelopathy, thoracic region: Secondary | ICD-10-CM | POA: Diagnosis not present

## 2020-01-15 DIAGNOSIS — R261 Paralytic gait: Secondary | ICD-10-CM | POA: Diagnosis not present

## 2020-02-13 DIAGNOSIS — R261 Paralytic gait: Secondary | ICD-10-CM | POA: Diagnosis not present

## 2020-02-13 DIAGNOSIS — M4714 Other spondylosis with myelopathy, thoracic region: Secondary | ICD-10-CM | POA: Diagnosis not present

## 2020-02-13 DIAGNOSIS — M62838 Other muscle spasm: Secondary | ICD-10-CM | POA: Diagnosis not present

## 2020-02-13 DIAGNOSIS — Z79899 Other long term (current) drug therapy: Secondary | ICD-10-CM | POA: Diagnosis not present

## 2020-02-13 DIAGNOSIS — F17210 Nicotine dependence, cigarettes, uncomplicated: Secondary | ICD-10-CM | POA: Diagnosis not present

## 2020-03-14 DIAGNOSIS — M62838 Other muscle spasm: Secondary | ICD-10-CM | POA: Diagnosis not present

## 2020-03-14 DIAGNOSIS — Z79899 Other long term (current) drug therapy: Secondary | ICD-10-CM | POA: Diagnosis not present

## 2020-03-14 DIAGNOSIS — M4714 Other spondylosis with myelopathy, thoracic region: Secondary | ICD-10-CM | POA: Diagnosis not present

## 2020-03-14 DIAGNOSIS — R261 Paralytic gait: Secondary | ICD-10-CM | POA: Diagnosis not present

## 2020-03-14 DIAGNOSIS — F17210 Nicotine dependence, cigarettes, uncomplicated: Secondary | ICD-10-CM | POA: Diagnosis not present

## 2020-04-15 DIAGNOSIS — F17210 Nicotine dependence, cigarettes, uncomplicated: Secondary | ICD-10-CM | POA: Diagnosis not present

## 2020-04-15 DIAGNOSIS — R261 Paralytic gait: Secondary | ICD-10-CM | POA: Diagnosis not present

## 2020-04-15 DIAGNOSIS — Z79899 Other long term (current) drug therapy: Secondary | ICD-10-CM | POA: Diagnosis not present

## 2020-04-15 DIAGNOSIS — M62838 Other muscle spasm: Secondary | ICD-10-CM | POA: Diagnosis not present

## 2020-04-15 DIAGNOSIS — M4714 Other spondylosis with myelopathy, thoracic region: Secondary | ICD-10-CM | POA: Diagnosis not present

## 2020-05-13 DIAGNOSIS — R261 Paralytic gait: Secondary | ICD-10-CM | POA: Diagnosis not present

## 2020-05-13 DIAGNOSIS — M4714 Other spondylosis with myelopathy, thoracic region: Secondary | ICD-10-CM | POA: Diagnosis not present

## 2020-05-13 DIAGNOSIS — M62838 Other muscle spasm: Secondary | ICD-10-CM | POA: Diagnosis not present

## 2020-05-13 DIAGNOSIS — Z79899 Other long term (current) drug therapy: Secondary | ICD-10-CM | POA: Diagnosis not present

## 2020-05-13 DIAGNOSIS — F17210 Nicotine dependence, cigarettes, uncomplicated: Secondary | ICD-10-CM | POA: Diagnosis not present
